# Patient Record
Sex: Male | Born: 1955 | Race: White | Hispanic: Yes | Marital: Married | Smoking: Never smoker
Health system: Southern US, Community
[De-identification: ages and names within clinical notes are randomized; demographics above are authoritative.]

## PROBLEM LIST (undated history)

## (undated) DIAGNOSIS — K219 Gastro-esophageal reflux disease without esophagitis: Secondary | ICD-10-CM

## (undated) DIAGNOSIS — T7840XA Allergy, unspecified, initial encounter: Secondary | ICD-10-CM

## (undated) DIAGNOSIS — I10 Essential (primary) hypertension: Secondary | ICD-10-CM

## (undated) DIAGNOSIS — E785 Hyperlipidemia, unspecified: Secondary | ICD-10-CM

## (undated) HISTORY — DX: Hyperlipidemia, unspecified: E78.5

## (undated) HISTORY — DX: Allergy, unspecified, initial encounter: T78.40XA

## (undated) HISTORY — PX: OTHER SURGICAL HISTORY: SHX169

## (undated) HISTORY — PX: LIPOMA EXCISION: SHX5283

## (undated) HISTORY — DX: Essential (primary) hypertension: I10

## (undated) HISTORY — DX: Gastro-esophageal reflux disease without esophagitis: K21.9

## (undated) HISTORY — PX: ARTHROSCOPY KNEE W/ DRILLING: SUR92

---

## 2010-08-11 ENCOUNTER — Ambulatory Visit (AMBULATORY_SURGERY_CENTER): Payer: BC Managed Care – PPO | Admitting: *Deleted

## 2010-08-11 VITALS — Ht 70.0 in | Wt 196.7 lb

## 2010-08-11 DIAGNOSIS — Z1211 Encounter for screening for malignant neoplasm of colon: Secondary | ICD-10-CM

## 2010-08-11 MED ORDER — PEG-KCL-NACL-NASULF-NA ASC-C 100 G PO SOLR: ORAL | Status: DC

## 2010-08-12 ENCOUNTER — Telehealth: Payer: Self-pay | Admitting: Internal Medicine

## 2010-08-12 ENCOUNTER — Encounter: Payer: Self-pay | Admitting: Internal Medicine

## 2010-08-12 NOTE — Telephone Encounter (Signed)
Movi Prep order called to [facility]  [place]

## 2010-08-18 ENCOUNTER — Encounter: Payer: Self-pay | Admitting: Internal Medicine

## 2010-08-18 ENCOUNTER — Ambulatory Visit (AMBULATORY_SURGERY_CENTER): Payer: BC Managed Care – PPO | Admitting: Internal Medicine

## 2010-08-18 VITALS — BP 143/80 | HR 71 | Temp 98.6°F | Resp 14 | Ht 70.0 in | Wt 192.0 lb

## 2010-08-18 DIAGNOSIS — Z1211 Encounter for screening for malignant neoplasm of colon: Secondary | ICD-10-CM

## 2010-08-18 DIAGNOSIS — K5730 Diverticulosis of large intestine without perforation or abscess without bleeding: Secondary | ICD-10-CM

## 2010-08-18 DIAGNOSIS — R933 Abnormal findings on diagnostic imaging of other parts of digestive tract: Secondary | ICD-10-CM

## 2010-08-18 DIAGNOSIS — K5989 Other specified functional intestinal disorders: Secondary | ICD-10-CM

## 2010-08-18 DIAGNOSIS — K598 Other specified functional intestinal disorders: Secondary | ICD-10-CM

## 2010-08-18 HISTORY — PX: COLONOSCOPY: SHX174

## 2010-08-18 MED ORDER — SODIUM CHLORIDE 0.9 % IV SOLN: 500.0000 mL | INTRAVENOUS | Status: DC

## 2010-08-18 NOTE — Progress Notes (Signed)
Patient assist to bathroom to aide in passing gas. Abdomin soft and nondistented . levsin given sl with no results. Ambulated around nursing station to help in expelling air. Will continue to monitor. Moderate amount of air expelled. Patient stated feel much better. Will discharge home.

## 2010-08-18 NOTE — Patient Instructions (Signed)
Discharge instructions given with verbal understanding. Handout on diverticulosis and hemorrhoids given. Resume previous medications.

## 2010-08-19 ENCOUNTER — Telehealth: Payer: Self-pay | Admitting: *Deleted

## 2010-08-19 NOTE — Telephone Encounter (Signed)
Busy signal

## 2010-10-13 NOTE — Progress Notes (Signed)
Addended by: Virgel Paling on: 10/13/2010 03:43 PM   Modules accepted: Orders

## 2019-05-09 ENCOUNTER — Other Ambulatory Visit (INDEPENDENT_AMBULATORY_CARE_PROVIDER_SITE_OTHER): Payer: BC Managed Care – PPO

## 2019-05-09 ENCOUNTER — Encounter: Payer: Self-pay | Admitting: Internal Medicine

## 2019-05-09 ENCOUNTER — Ambulatory Visit (INDEPENDENT_AMBULATORY_CARE_PROVIDER_SITE_OTHER): Payer: BC Managed Care – PPO | Admitting: Internal Medicine

## 2019-05-09 ENCOUNTER — Other Ambulatory Visit: Payer: Self-pay

## 2019-05-09 VITALS — BP 124/72 | HR 60 | Temp 98.0°F | Ht 71.0 in | Wt 190.4 lb

## 2019-05-09 DIAGNOSIS — K644 Residual hemorrhoidal skin tags: Secondary | ICD-10-CM

## 2019-05-09 DIAGNOSIS — R1032 Left lower quadrant pain: Secondary | ICD-10-CM

## 2019-05-09 DIAGNOSIS — K5730 Diverticulosis of large intestine without perforation or abscess without bleeding: Secondary | ICD-10-CM

## 2019-05-09 LAB — BASIC METABOLIC PANEL
BUN: 18 mg/dL (ref 6–23)
CO2: 29 mEq/L (ref 19–32)
Calcium: 8.9 mg/dL (ref 8.4–10.5)
Chloride: 106 mEq/L (ref 96–112)
Creatinine, Ser: 1.01 mg/dL (ref 0.40–1.50)
GFR: 74.38 mL/min (ref >60.00)
Glucose, Bld: 99 mg/dL (ref 70–99)
Potassium: 3.9 mEq/L (ref 3.5–5.1)
Sodium: 140 mEq/L (ref 135–145)

## 2019-05-09 NOTE — Patient Instructions (Signed)
Your provider has requested that you go to the basement level for lab work before leaving today. Press "B" on the elevator. The lab is located at the first door on the left as you exit the elevator.  You have been scheduled for a CT scan of the abdomen and pelvis at [facility] - 1st floor.  You are scheduled on 05/17/2019 at 8:30am. You should arrive 15 minutes prior to your appointment time for registration. Please follow the written instructions below on the day of your exam:  WARNING: IF YOU ARE ALLERGIC TO IODINE/X-RAY DYE, PLEASE NOTIFY RADIOLOGY IMMEDIATELY AT [phone]! YOU WILL BE GIVEN A 13 HOUR PREMEDICATION PREP.  1) Do not eat or drink anything after 4:30am (4 hours prior to your test) 2) You have been given 2 bottles of oral contrast to drink. The solution may taste better if refrigerated, but do NOT add ice or any other liquid to this solution. Shake well before drinking.    Drink 1 bottle of contrast @ 6:30am (2 hours prior to your exam)  Drink 1 bottle of contrast @ 7:30am (1 hour prior to your exam)  You may take any medications as prescribed with a small amount of water, if necessary. If you take any of the following medications: METFORMIN, GLUCOPHAGE, GLUCOVANCE, AVANDAMET, RIOMET, FORTAMET, [place] MET, JANUMET, GLUMETZA or METAGLIP, you MAY be asked to HOLD this medication 48 hours AFTER the exam.  The purpose of you drinking the oral contrast is to aid in the visualization of your intestinal tract. The contrast solution may cause some diarrhea. Depending on your individual set of symptoms, you may also receive an intravenous injection of x-ray contrast/dye. Plan on being at [facility] for 30 minutes or longer, depending on the type of exam you are having performed.  This test typically takes 30-45 minutes to complete.  If you have any questions regarding your exam or if you need to reschedule, you may call the CT department at [phone] between the  hours of 8:00 am and 5:00 pm, Monday-Friday.  ________________________________________________________________________

## 2019-05-09 NOTE — Progress Notes (Signed)
HISTORY OF PRESENT ILLNESS:  Albert Cox is a 64 y.o. male with past medical history as listed below who is sent today by his primary care provider regarding left lower quadrant pain.  Question hernia.  I last saw the patient August 18, 2010 when he underwent routine screening colonoscopy.  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  An erythematous left colon fold was biopsied and returned benign changes.  He is accompanied today by his wife.  He tells me that he has had intermittent fleeting left lower quadrant pain for approximately 1 to 2 months.  He is concerned that he has a hernia.  No fevers.  Reports normal regular bowel movements without change.  No bleeding.  He has had some weight gain.  He has not had his Covid vaccination to date.  He does not identify any particular factors that seem to either relieve or exacerbate the discomfort.  No discomfort yesterday.  Some discomfort currently.  His GI review of systems is otherwise remarkable for what he describes as external hemorrhoids.  He declines my offer to evaluate these for him.  I have reviewed outside evaluation with his primary care provider.  The office note did raise the question of left inguinal hernia.  Review of blood work from April 25, 2019 shows unremarkable comprehensive metabolic panel.  Unremarkable CBC.  Normal hemoglobin of 14.6.  Normal liver test.  REVIEW OF SYSTEMS:  All non-GI ROS negative unless otherwise stated in the HPI except for allergies  Past Medical History:  Diagnosis Date  . Allergy   . GERD (gastroesophageal reflux disease)   . Hyperlipidemia   . Hypertension     Past Surgical History:  Procedure Laterality Date  . ARTHROSCOPY KNEE W/ DRILLING     lt. knee  . coreneal transplants     both eyes    Social History Albert Cox  reports that he has never smoked. He has never used smokeless tobacco. He reports that he does not drink alcohol or use drugs.  family history  includes Alcohol abuse in his father; Heart disease in his mother.  No Known Allergies     PHYSICAL EXAMINATION: Vital signs: BP 124/72   Pulse 60   Temp 98 F (36.7 C)   Ht 5\' 11"  (1.803 m)   Wt 190 lb 6 oz (86.4 kg)   BMI 26.55 kg/m   Constitutional: generally well-appearing, no acute distress Psychiatric: alert and oriented x3, cooperative Eyes: extraocular movements intact, anicteric, conjunctiva pink Mouth: Mask Neck: supple no lymphadenopathy Cardiovascular: heart regular rate and rhythm, no murmur Lungs: clear to auscultation bilaterally Abdomen: soft, focal left lower quadrant tenderness, nondistended, no obvious ascites, no peritoneal signs, normal bowel sounds, no organomegaly.  No hernia Groin.  No obvious hernia Rectal: Declined Extremities: no clubbing, cyanosis, or lower extremity edema bilaterally Skin: no lesions on visible extremities Neuro: No focal deficits.  Cranial nerves intact  ASSESSMENT:  1.  37-month history of intermittent left lower quadrant pain.  Rule out diverticulitis.  Rule out diverticular spasm.  Rule out other entity 2.  Screening colonoscopy 2012 with severe pandiverticulosis.  No neoplasia. 3.  Complaints of external hemorrhoids.  Declined evaluation but inquired about banding procedure at the time of his next screening colonoscopy.  I explained to him the difference between internal and external hemorrhoids and the various treatment options.  Unclear what would be best for him without directly evaluating.  He understands.  PLAN:  1.  High-fiber diet  2.  Schedule CT scan of the abdomen and pelvis to evaluate persistent left lower quadrant pain.  We will contact him with the results after they are available. 3.  No evidence for hernia 4.  Screening colonoscopy around June 2022 5.  Further recommendations after the above completed

## 2019-05-17 ENCOUNTER — Ambulatory Visit (HOSPITAL_COMMUNITY)
Admission: RE | Admit: 2019-05-17 | Discharge: 2019-05-17 | Disposition: A | Discharge status: Home / Self Care | Payer: BC Managed Care – PPO | Source: Ambulatory Visit | Attending: Internal Medicine | Admitting: Internal Medicine

## 2019-05-17 ENCOUNTER — Other Ambulatory Visit: Payer: Self-pay

## 2019-05-17 ENCOUNTER — Encounter (HOSPITAL_COMMUNITY): Payer: Self-pay

## 2019-05-17 DIAGNOSIS — N289 Disorder of kidney and ureter, unspecified: Secondary | ICD-10-CM

## 2019-05-17 DIAGNOSIS — R1032 Left lower quadrant pain: Secondary | ICD-10-CM | POA: Diagnosis not present

## 2019-05-17 IMAGING — CT CT ABD-PELV W/ CM
2 of 5 series · 15 of 46 positions shown, 17 images · IV contrast (OMNIPAQUE)
Comparison: None.

CLINICAL DATA: Left lower quadrant abdominal pain for 1 month.

EXAM:
CT ABDOMEN AND PELVIS WITH CONTRAST
TECHNIQUE: Multidetector CT imaging of the abdomen and pelvis was performed
using the standard protocol following bolus administration of
intravenous contrast.
CONTRAST:  100mL OMNIPAQUE IOHEXOL 300 MG/ML  SOLN

[Series 2: axial st · axial · 0.80mm/px · z∈[+1166,+1586]mm · 12 of 98 slices shown, 14 images]
[im 7/98  soft-tissue]
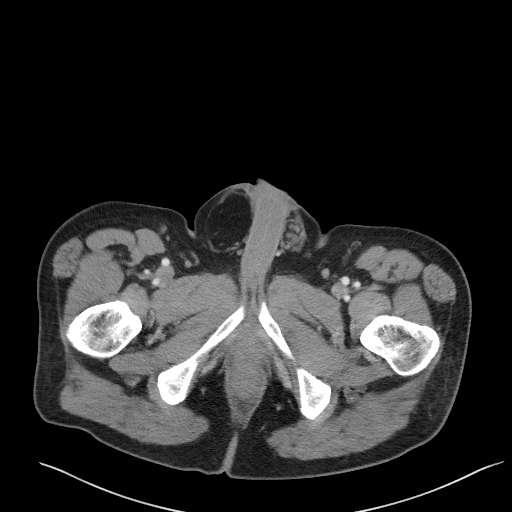
[im 7/98  bone]
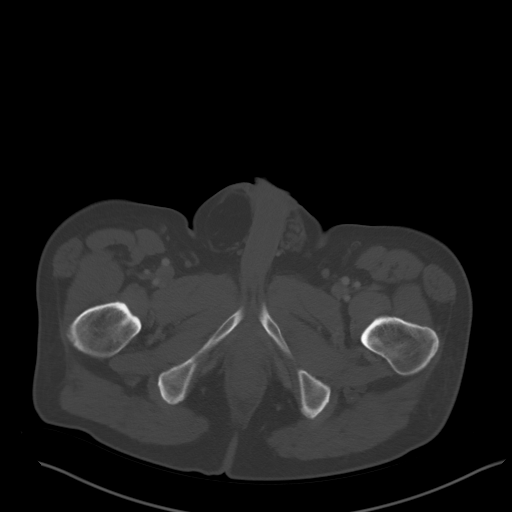
[im 13/98  soft-tissue]
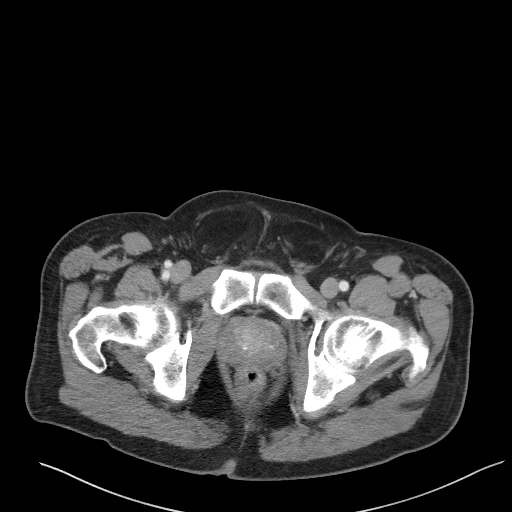
[im 25/98  soft-tissue]
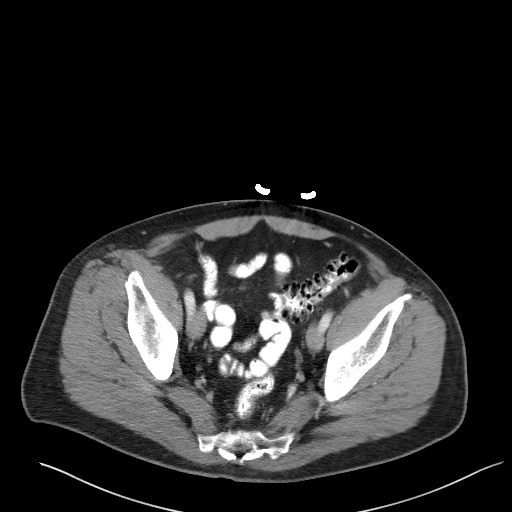
[im 31/98  soft-tissue]
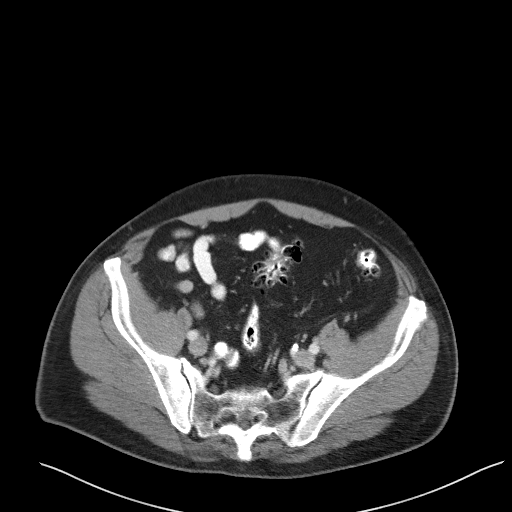
[im 37/98  soft-tissue]
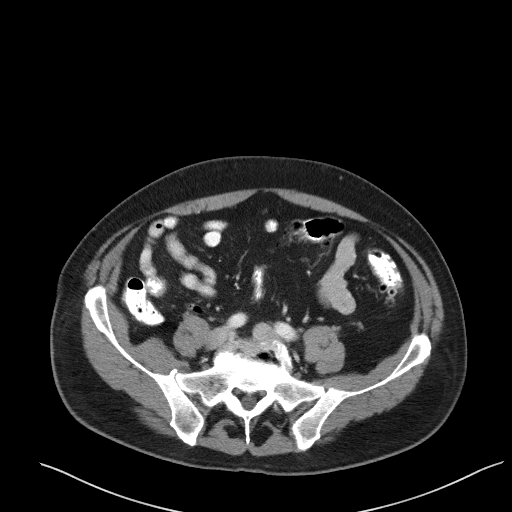
[im 43/98  soft-tissue]
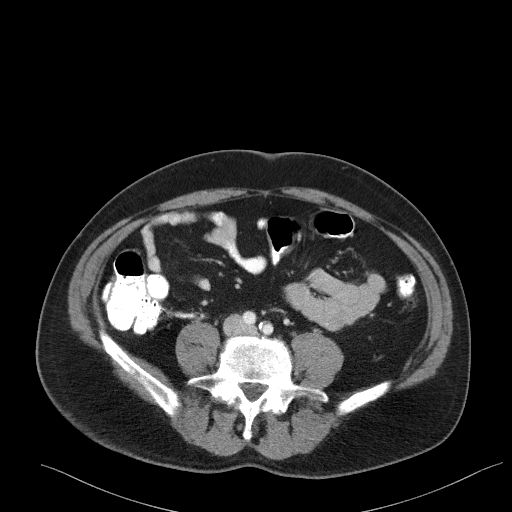
[im 55/98  soft-tissue]
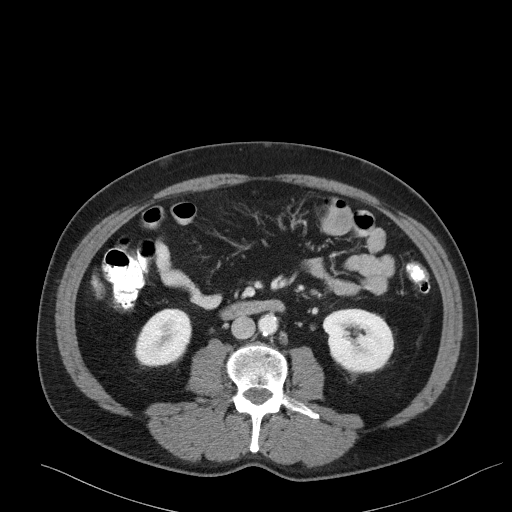
[im 61/98  soft-tissue]
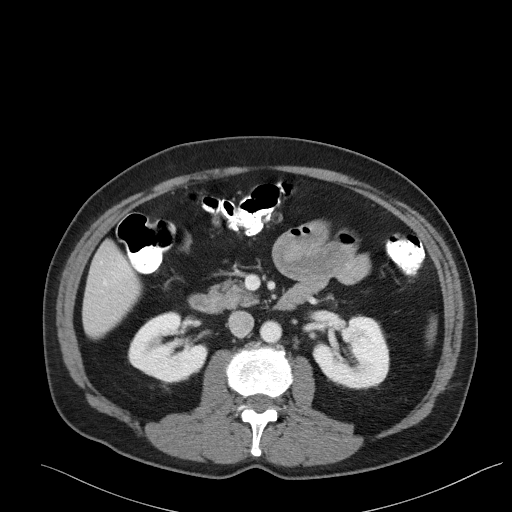
[im 67/98  soft-tissue]
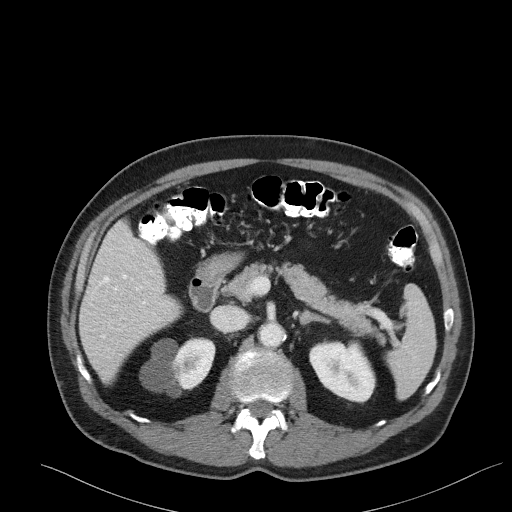
[im 67/98  bone]
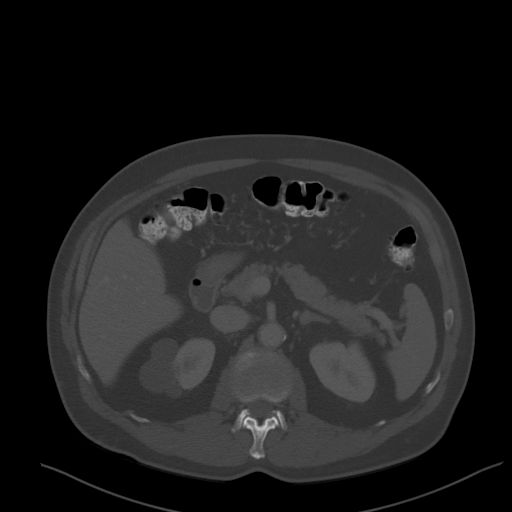
[im 73/98  soft-tissue]
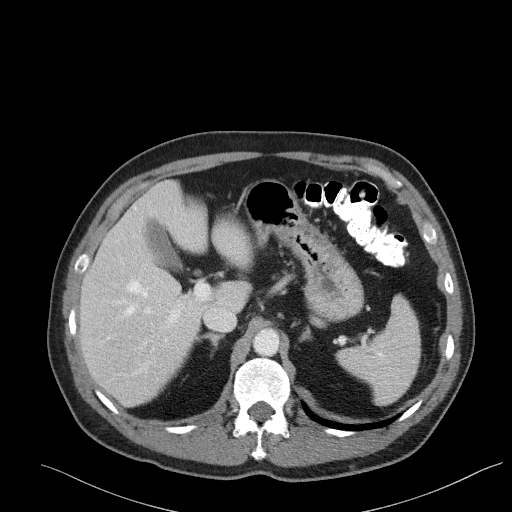
[im 85/98  soft-tissue]
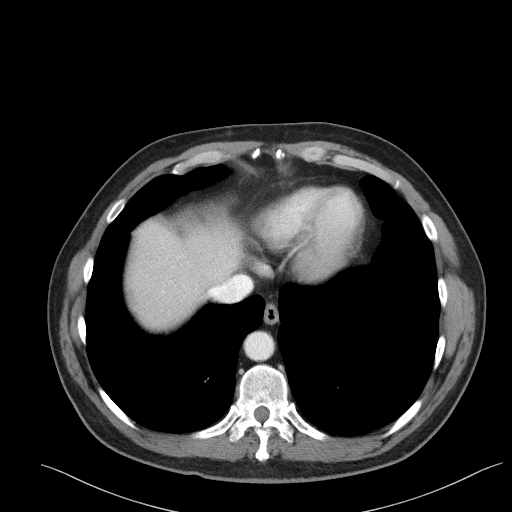
[im 91/98  soft-tissue]
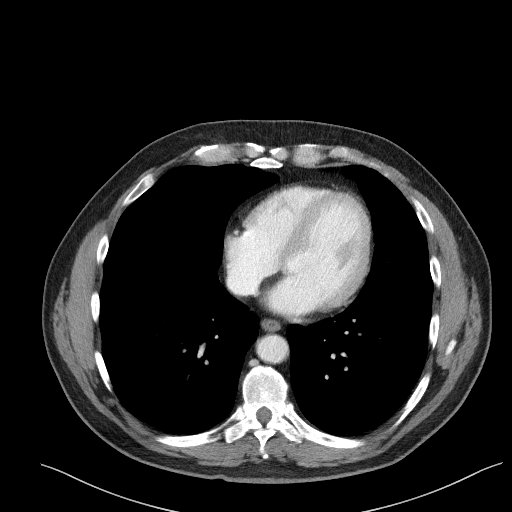

[Series 4: coronal st · coronal · 0.77mm/px · 3 of 98 slices shown]
[im 33/98  soft-tissue]
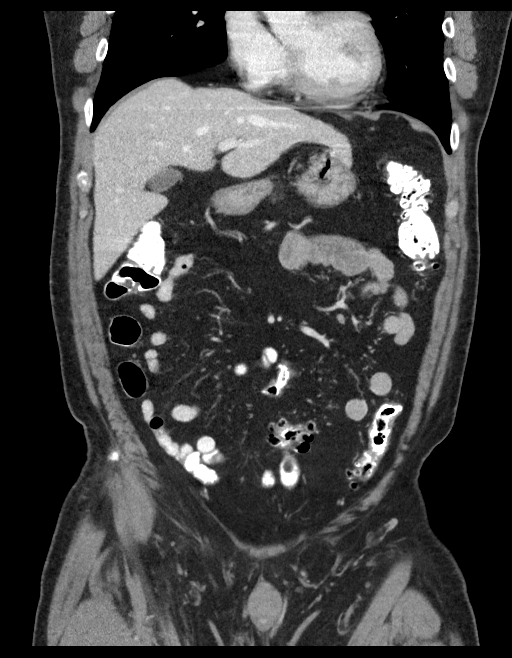
[im 44/98  soft-tissue]
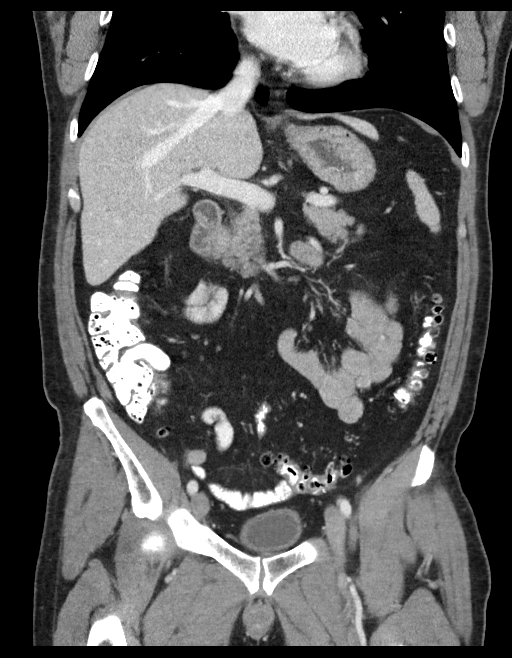
[im 54/98  soft-tissue]
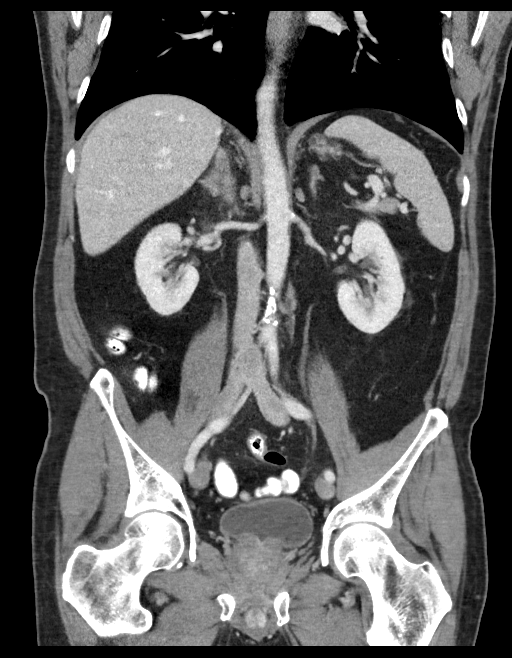

[15 of 46 positions shown; findings below may reference images not displayed]

FINDINGS: Lower chest: 6 mm pulmonary nodule identified left lower lobe on
image 43/6.

Hepatobiliary: No suspicious focal abnormality within the liver
parenchyma. There is no evidence for gallstones, gallbladder wall
thickening, or pericholecystic fluid. No intrahepatic or
extrahepatic biliary dilation.

Pancreas: No focal mass lesion. No dilatation of the main duct. No
intraparenchymal cyst. No peripancreatic edema.

Spleen: No splenomegaly. No focal mass lesion.

Adrenals/Urinary Tract: No adrenal nodule or mass. Complex
multicystic lesion in the upper pole right kidney measures 4.8 x
x 4.0 cm with dystrophic calcification along the inferior margin.
Left kidney unremarkable. No evidence for hydroureter. The urinary
bladder appears normal for the degree of distention.

Stomach/Bowel: Stomach is unremarkable. No gastric wall thickening.
No evidence of outlet obstruction. Duodenum is normally positioned
as is the ligament of Treitz. No small bowel wall thickening. No
small bowel dilatation. The terminal ileum is normal. The appendix
is normal. No gross colonic mass. No colonic wall thickening.
Diverticular changes are noted in the left colon without evidence of
diverticulitis.

Vascular/Lymphatic: There is abdominal aortic atherosclerosis
without aneurysm. There is no gastrohepatic or hepatoduodenal
ligament lymphadenopathy. No retroperitoneal or mesenteric
lymphadenopathy. No pelvic sidewall lymphadenopathy.

Reproductive: Prostate gland appears enlarged.

Other: No intraperitoneal free fluid.

Musculoskeletal: Bilateral groin hernias contain only fat, right
greater than left. Small umbilical hernia contains only fat. No
worrisome lytic or sclerotic osseous abnormality.
IMPRESSION: 1. No acute findings in the abdomen or pelvis. Specifically, no
findings to explain the patient's history of left lower quadrant
pain.
2. 4.8 cm complex multicystic lesion upper pole right kidney with
dystrophic calcification. MRI without and with contrast recommended
to further evaluate.
3. 6 mm left lower lobe pulmonary nodule. Non-contrast chest CT at
6-12 months is recommended. If the nodule is stable at time of
repeat CT, then future CT at 18-24 months (from today's scan) is
considered optional for low-risk patients, but is recommended for
high-risk patients. This recommendation follows the consensus
statement: Guidelines for Management of Incidental Pulmonary Nodules
Detected on CT Images: From the [HOSPITAL] [Y9]; Radiology
4. Left colonic diverticulosis without diverticulitis.
5. Aortic Atherosclerosis ([Y9]-[Y9]).

## 2019-05-17 MED ORDER — SODIUM CHLORIDE (PF) 0.9 % IJ SOLN
INTRAMUSCULAR | Status: AC
  Filled 2019-05-17: qty 50

## 2019-05-17 MED ORDER — IOHEXOL 300 MG/ML  SOLN
100.0000 mL | Freq: Once | INTRAMUSCULAR | Status: AC | PRN
  Administered 2019-05-17: 100 mL via INTRAVENOUS

## 2019-05-24 ENCOUNTER — Other Ambulatory Visit: Payer: Self-pay

## 2019-05-24 DIAGNOSIS — R9389 Abnormal findings on diagnostic imaging of other specified body structures: Secondary | ICD-10-CM

## 2019-05-24 DIAGNOSIS — N289 Disorder of kidney and ureter, unspecified: Secondary | ICD-10-CM

## 2019-05-28 ENCOUNTER — Other Ambulatory Visit: Payer: Self-pay

## 2019-05-28 ENCOUNTER — Ambulatory Visit (HOSPITAL_COMMUNITY)
Admission: RE | Admit: 2019-05-28 | Discharge: 2019-05-28 | Disposition: A | Discharge status: Home / Self Care | Payer: BC Managed Care – PPO | Source: Ambulatory Visit | Attending: Internal Medicine | Admitting: Internal Medicine

## 2019-05-28 DIAGNOSIS — R9389 Abnormal findings on diagnostic imaging of other specified body structures: Secondary | ICD-10-CM

## 2019-05-28 DIAGNOSIS — N289 Disorder of kidney and ureter, unspecified: Secondary | ICD-10-CM

## 2019-05-28 IMAGING — MR MR ABDOMEN WO/W CM
18 series · 48 of 48 positions shown · IV contrast (9 GADAVIST)
Comparison: CT scan [DATE]

CLINICAL DATA: 4.8 cm complex multicystic right renal lesion.

EXAM:
MRI ABDOMEN WITHOUT AND WITH CONTRAST
TECHNIQUE: Multiplanar multisequence MR imaging of the abdomen was performed
both before and after the administration of intravenous contrast.
CONTRAST:  9mL GADAVIST GADOBUTROL 1 MMOL/ML IV SOLN

[Series 2: T2 · coronal · 6.0mm · 1.56mm/px · 2 of 30 slices shown]
[im 1/30]
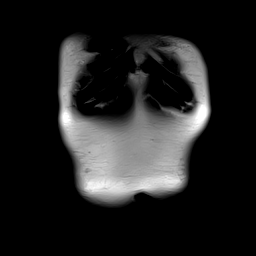
[im 30/30]
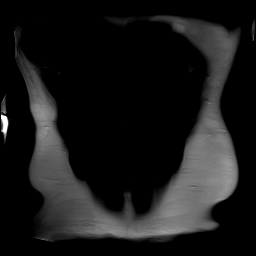

[Series 3: T2 fat-sat · 2 of 6 slices shown (1 of 2)]
[im 1/6]
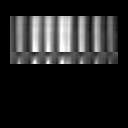
[im 6/6]
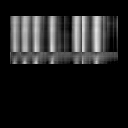

[Series 4: T2 fat-sat · axial · 6.0mm · 1.25mm/px · z∈[-89,+163]mm · 2 of 36 slices shown (2 of 2)]
[im 1/36]
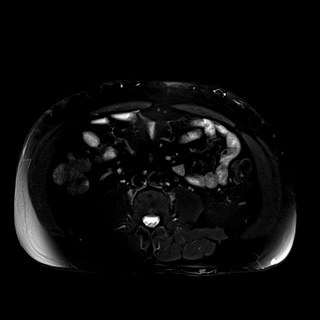
[im 36/36]
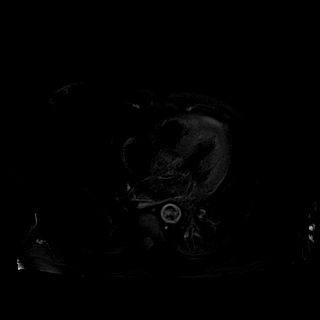

[Series 5: T1 · axial · 3.0mm · 1.25mm/px · z∈[-91,+146]mm · 3 of 80 slices shown (1 of 2)]
[im 1/80]
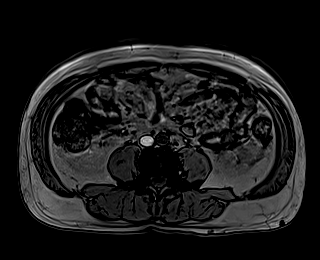
[im 40/80]
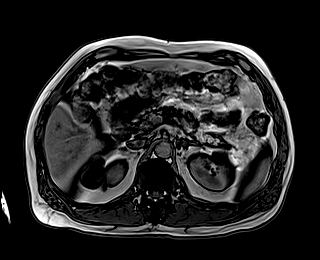
[im 80/80]
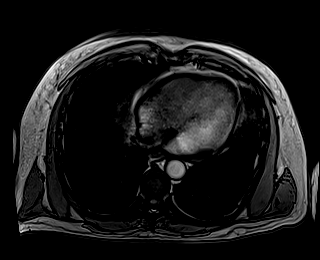

[Series 6: T1 · axial · 3.0mm · 1.25mm/px · z∈[-91,+146]mm · 3 of 80 slices shown (2 of 2)]
[im 1/80]
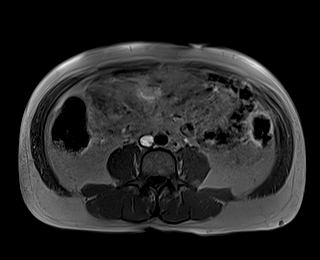
[im 40/80]
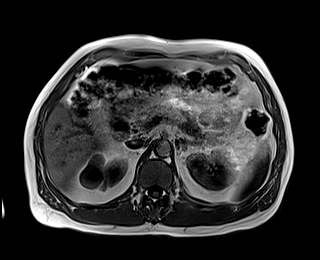
[im 80/80]
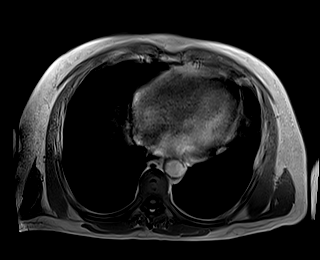

[Series 7: DWI · axial · 6.0mm · 1.49mm/px · z∈[-103,+149]mm · 3 of 72 slices shown (1 of 2)]
[im 1/72]
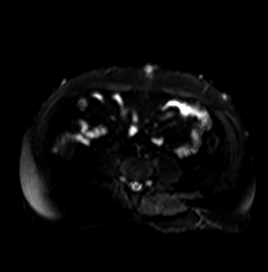
[im 36/72]
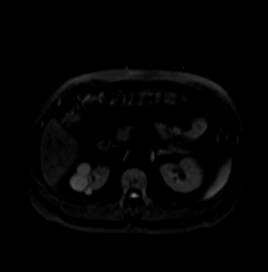
[im 72/72]
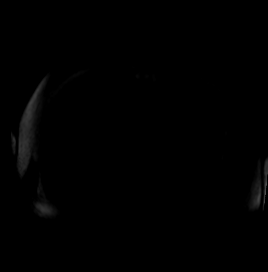

[Series 8: DWI · axial · 6.0mm · 1.49mm/px · 1 of 36 slices shown (2 of 2)]
[im 1/36]
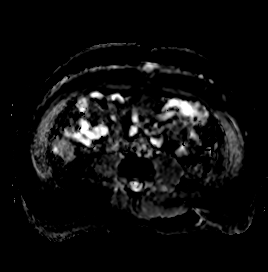

[Series 9: bSSFP · axial · 5.0mm · 0.84mm/px · z∈[-91,+146]mm · 2 of 44 slices shown]
[im 1/44]
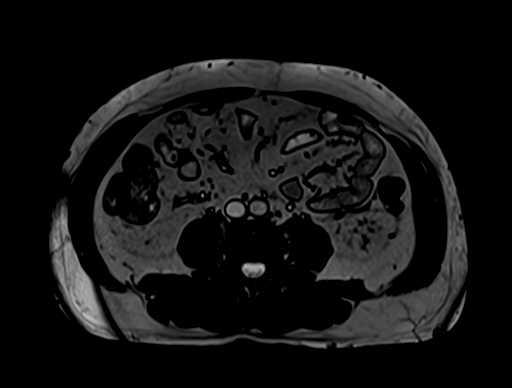
[im 44/44]
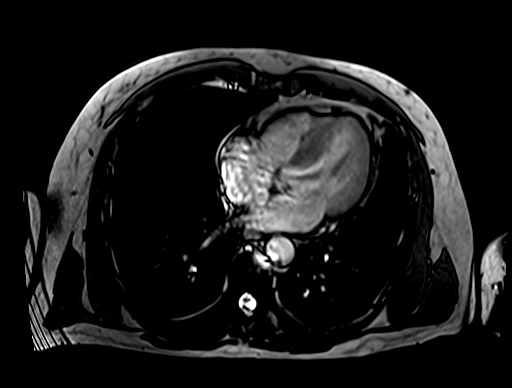

[Series 11: T1 dynamic · axial · 3.0mm · 1.25mm/px · z∈[-73,+164]mm · 3 of 80 slices shown (1 of 10)]
[im 1/80]
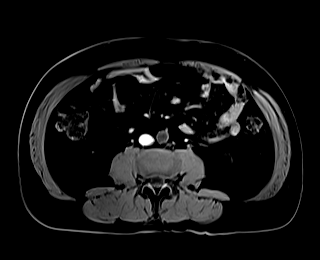
[im 40/80]
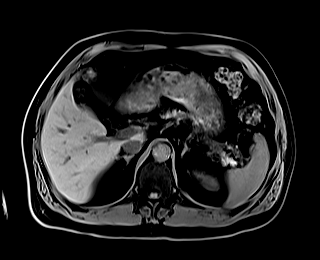
[im 80/80]
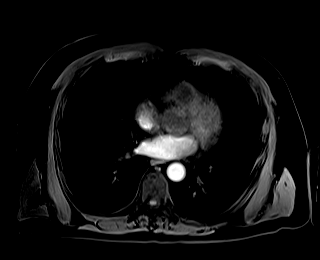

[Series 15: T1 dynamic · axial · 3.0mm · 1.25mm/px · z∈[-73,+164]mm · 3 of 80 slices shown (2 of 10)]
[im 1/80]
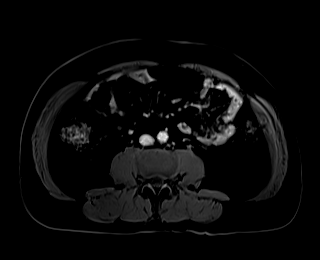
[im 40/80]
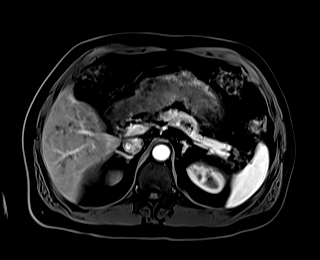
[im 80/80]
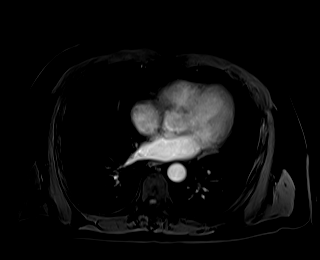

[Series 16: T1 dynamic · axial · 3.0mm · 1.25mm/px · z∈[-73,+164]mm · 3 of 80 slices shown (3 of 10)]
[im 1/80]
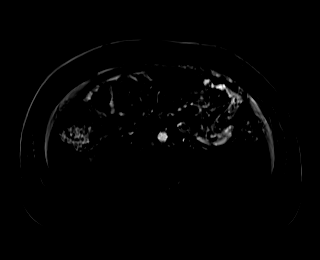
[im 40/80]
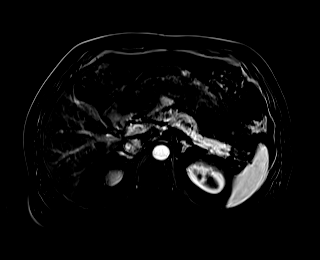
[im 80/80]
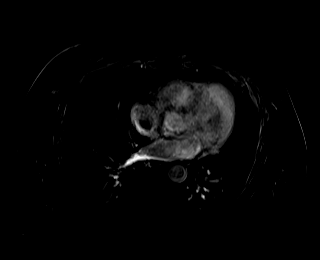

[Series 19: T1 dynamic · axial · 3.0mm · 1.25mm/px · z∈[-73,+164]mm · 3 of 80 slices shown (4 of 10)]
[im 1/80]
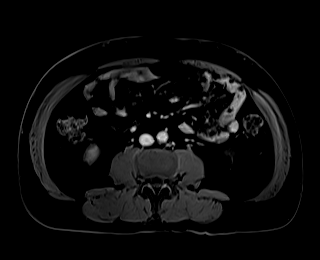
[im 40/80]
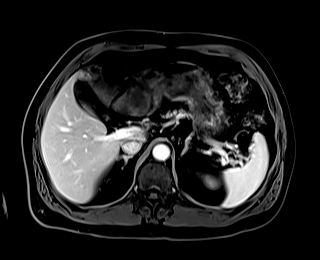
[im 80/80]
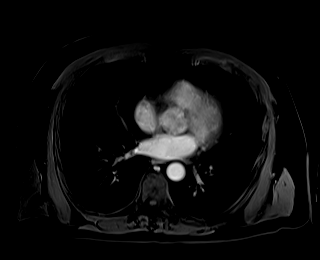

[Series 20: T1 dynamic · axial · 3.0mm · 1.25mm/px · z∈[-73,+164]mm · 3 of 80 slices shown (5 of 10)]
[im 1/80]
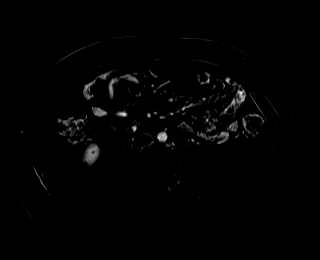
[im 40/80]
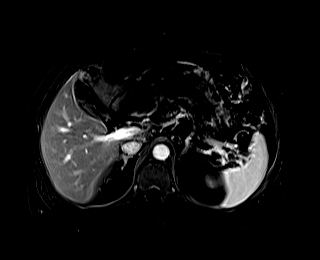
[im 80/80]
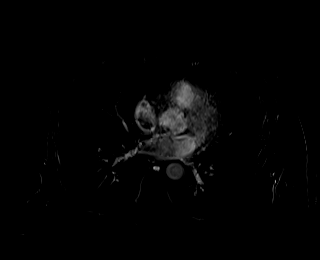

[Series 23: T1 dynamic · axial · 3.0mm · 1.25mm/px · z∈[-73,+164]mm · 3 of 80 slices shown (6 of 10)]
[im 1/80]
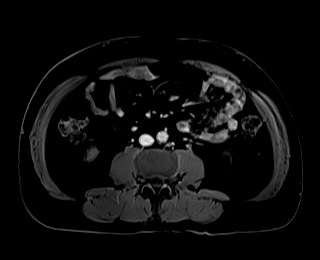
[im 40/80]
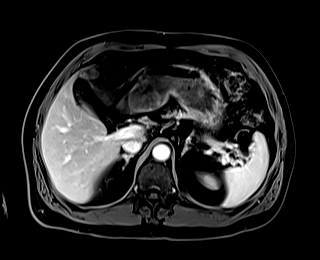
[im 80/80]
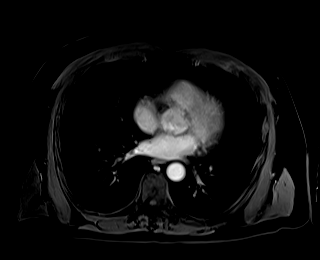

[Series 24: T1 dynamic · axial · 3.0mm · 1.25mm/px · z∈[-73,+164]mm · 3 of 80 slices shown (7 of 10)]
[im 1/80]
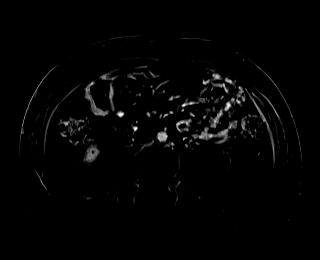
[im 40/80]
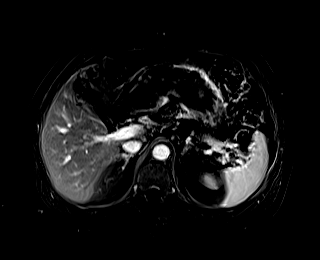
[im 80/80]
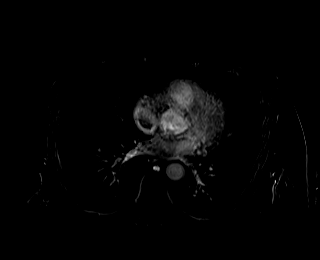

[Series 25: T1 dynamic · coronal · 3.0mm · 1.41mm/px · 3 of 72 slices shown (8 of 10)]
[im 1/72]
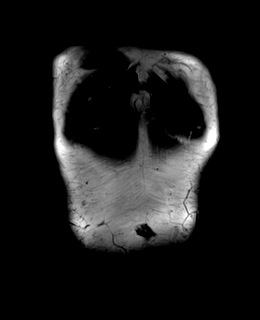
[im 36/72]
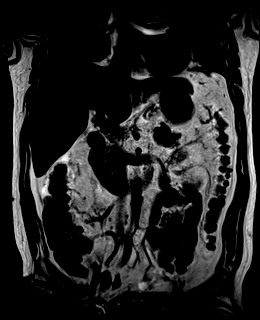
[im 72/72]
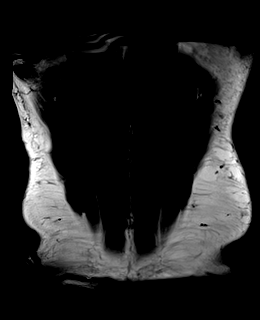

[Series 26: T1 dynamic · coronal · 3.0mm · 1.41mm/px · 3 of 72 slices shown (9 of 10)]
[im 1/72]
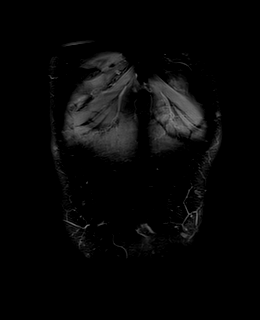
[im 36/72]
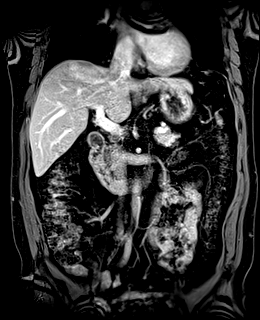
[im 72/72]
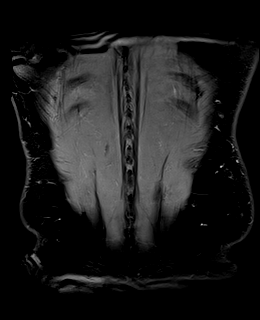

[Series 28: T1 dynamic · axial · 3.0mm · 1.25mm/px · z∈[-84,+153]mm · 3 of 80 slices shown (10 of 10)]
[im 1/80]
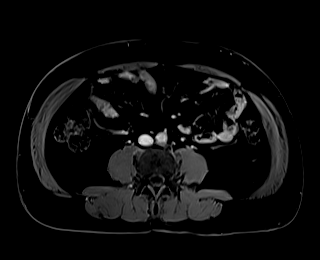
[im 40/80]
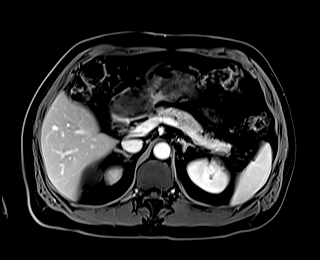
[im 80/80]
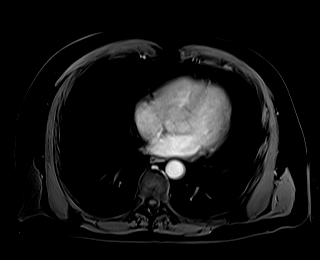

[48 of 48 positions shown; findings below may reference images not displayed]

FINDINGS: Lower chest: Unremarkable.

Hepatobiliary: No suspicious focal abnormality within the liver
parenchyma. There is no evidence for gallstones, gallbladder wall
thickening, or pericholecystic fluid. No intrahepatic or
extrahepatic biliary dilation.

Pancreas: No focal mass lesion. No dilatation of the main duct. No
intraparenchymal cyst. No peripancreatic edema.

Spleen:  Tiny cyst or pseudocyst noted in the dome of the spleen.

Adrenals/Urinary Tract: No adrenal nodule or mass. Left kidney
unremarkable.

As seen on recent CT scan, there is a multicystic lesion in the
upper interpolar region of the right kidney measuring 4.7 x 3.0 x
3.6 cm on today's MRI. The dystrophic calcifications seen on
previous CT is not evident on MR imaging. Multiple thin, smooth
septations are identified without measurable enhancement. There is
prominent cortical thinning with scarring/irregularity along the
inferior aspect of this margin. No suspicious irregular mural
thickening or mural nodularity.

Stomach/Bowel: Stomach is unremarkable. No gastric wall thickening.
No evidence of outlet obstruction. Duodenum is normally positioned
as is the ligament of Treitz. No small bowel or colonic dilatation
within the visualized abdomen.

Vascular/Lymphatic: No abdominal aortic aneurysm. No abdominal
lymphadenopathy.

Other:  No intraperitoneal free fluid.

Musculoskeletal: No abnormal marrow enhancement within the
visualized bony anatomy.
IMPRESSION: 1. 4.7 x 3.0 x 3.6 cm multicystic lesion in the upper interpolar
region of the right kidney is likely benign with no overtly
suspicious imaging features on today's exam. There is focal cortical
thinning/scarring along the inferior aspect of this lesion which
create some irregular enhancing tissue and it unclear whether this
is related to the background renal parenchyma or the lesion itself
although I favor that this is related to background
parenchymal/cortical scarring. As such, this lesion is categorized
as Bosniak II F and follow-up MRI in 6 months recommended.

## 2019-05-28 MED ORDER — GADOBUTROL 1 MMOL/ML IV SOLN
10.0000 mL | Freq: Once | INTRAVENOUS | Status: AC | PRN
  Administered 2019-05-28: 9 mL via INTRAVENOUS

## 2020-05-20 ENCOUNTER — Other Ambulatory Visit: Payer: Self-pay

## 2020-05-20 ENCOUNTER — Emergency Department (HOSPITAL_COMMUNITY)
Admission: EM | Admit: 2020-05-20 | Discharge: 2020-05-20 | Disposition: A | Discharge status: Home / Self Care | Payer: BC Managed Care – PPO | Attending: Emergency Medicine | Admitting: Emergency Medicine

## 2020-05-20 ENCOUNTER — Encounter (HOSPITAL_COMMUNITY): Payer: Self-pay

## 2020-05-20 DIAGNOSIS — R066 Hiccough: Secondary | ICD-10-CM | POA: Insufficient documentation

## 2020-05-20 DIAGNOSIS — Z79899 Other long term (current) drug therapy: Secondary | ICD-10-CM | POA: Diagnosis not present

## 2020-05-20 DIAGNOSIS — I10 Essential (primary) hypertension: Secondary | ICD-10-CM | POA: Insufficient documentation

## 2020-05-20 DIAGNOSIS — Z7982 Long term (current) use of aspirin: Secondary | ICD-10-CM | POA: Insufficient documentation

## 2020-05-20 MED ORDER — METOCLOPRAMIDE HCL 10 MG PO TABS: 10.0000 mg | ORAL_TABLET | Freq: Three times a day (TID) | ORAL | 0 refills | Status: DC | PRN

## 2020-05-20 NOTE — ED Triage Notes (Signed)
Pt c/o hiccups since Thursday. Pt tried omeprazole without relief. Pt states he had 1 episode of vomit on Friday.

## 2020-05-20 NOTE — ED Provider Notes (Signed)
[facility]-EMERGENCY DEPT Provider Note   CSN: [id number] Arrival date & time: 05/20/20  1629     History Chief Complaint  Patient presents with  . Hiccups    Albert Cox is a 65 y.o. male.  HPI Patient presents with hiccups. Patient just arrived via airplane from a trip to [place].  He notes that he has had the hiccups for the duration of his trip, 5 days.  Prior to this, he was generally well aside from 1 day of diarrhea-like illness.  Other family members had a similar illness at that time.  He had no GI issues during the travel, no abdominal pain, and continues to deny abdominal pain aside from discomfort with hiccuping. Pain 1 episode of vomiting due to the hiccups, no fever, no dyspnea, no other complaints. He has had transient relief only with breathing intentionally, otherwise no clear alleviating or exacerbating factors.    Past Medical History:  Diagnosis Date  . Allergy   . GERD (gastroesophageal reflux disease)   . Hyperlipidemia   . Hypertension     There are no problems to display for this patient.   Past Surgical History:  Procedure Laterality Date  . ARTHROSCOPY KNEE W/ DRILLING     lt. knee  . coreneal transplants     both eyes       Family History  Problem Relation Age of Onset  . Heart disease Mother   . Alcohol abuse Father     Social History   Tobacco Use  . Smoking status: Never Smoker  . Smokeless tobacco: Never Used  Vaping Use  . Vaping Use: Never used  Substance Use Topics  . Alcohol use: No    Comment: occasional  . Drug use: No    Home Medications Prior to Admission medications   Medication Sig Start Date End Date Taking? Authorizing Provider  metoCLOPramide (REGLAN) 10 MG tablet Take 1 tablet (10 mg total) by mouth every 8 (eight) hours as needed (hiccups). 05/20/20  Yes Gerhard Munch, MD  aspirin 81 MG tablet Take 81 mg by mouth daily.      [provider]  BYSTOLIC 10 MG tablet Take  10 mg by mouth daily. 05/06/19   [provider]  Calcium Carbonate-Vitamin D (CALCIUM-VITAMIN D) 500-200 MG-UNIT per tablet Take 1 tablet by mouth 3 (three) times a week.      [provider]  fish oil-omega-3 fatty acids 1000 MG capsule Take 1 g by mouth 3 (three) times a week.      [provider]  hydrALAZINE (APRESOLINE) 50 MG tablet Take 75 mg by mouth 2 (two) times daily.    [provider]  pravastatin (PRAVACHOL) 20 MG tablet Take 20 mg by mouth daily. 03/05/19   [provider]  pyridoxine (B-6) 200 MG tablet Take 100 mg by mouth daily.      [provider]  vitamin C (ASCORBIC ACID) 500 MG tablet Take 500 mg by mouth daily.      [provider]    Allergies    Patient has no known allergies.  Review of Systems   Review of Systems  Constitutional:       Per HPI, otherwise negative  HENT:       Per HPI, otherwise negative  Respiratory:       Per HPI, otherwise negative  Cardiovascular:       Per HPI, otherwise negative  Gastrointestinal: Negative for vomiting.  Endocrine:  Negative aside from HPI  Genitourinary:       Neg aside from HPI   Musculoskeletal:       Per HPI, otherwise negative  Skin: Negative.   Neurological: Negative for syncope.    Physical Exam Updated Vital Signs BP (!) 153/73 (BP Location: Left Arm)   Pulse (!) 56   Temp 97.6 F (36.4 C) (Oral)   Resp 18   Ht 5\' 10"  (1.778 m)   Wt 81.6 kg   SpO2 100%   BMI 25.83 kg/m   Physical Exam Vitals and nursing note reviewed.  Constitutional:      Appearance: He is well-developed.     Comments: Uncomfortable appearing adult male sitting upright, actively with hiccups  HENT:     Head: Normocephalic and atraumatic.  Eyes:     Conjunctiva/sclera: Conjunctivae normal.  Cardiovascular:     Rate and Rhythm: Normal rate and regular rhythm.  Pulmonary:     Effort: Pulmonary effort is normal. No respiratory distress.     Breath sounds: No  stridor.  Abdominal:     General: There is no distension.  Skin:    General: Skin is warm and dry.  Neurological:     Mental Status: He is alert and oriented to person, place, and time.     ED Results / Procedures / Treatments   Labs (all labs ordered are listed, but only abnormal results are displayed) Labs Reviewed - No data to display  EKG None  Radiology No results found.  Procedures Procedures   Medications Ordered in ED Medications - No data to display  ED Course  I have reviewed the triage vital signs and the nursing notes.  Pertinent labs & imaging results that were available during my care of the patient were reviewed by me and considered in my medical decision making (see chart for details).  After initial evaluation, I used a physiologic manipulation technique, and the patient had resolution of his hiccups.  He had no recurrence in the emergency department.  Without other complaints, without hemodynamic instability, patient discharged in stable condition with return precautions, ongoing instructions for hiccup management, and backup plan with prescriptions as well. Here, without other abdominal pain, fever, complaints, low suspicion for acute abdominal processes, no evidence of bacteremia, sepsis, no evidence for respiratory issues. Final Clinical Impression(s) / ED Diagnoses Final diagnoses:  Hiccups    Rx / DC Orders ED Discharge Orders         Ordered    metoCLOPramide (REGLAN) 10 MG tablet  Every 8 hours PRN        05/20/20 1714           05/22/20, MD 05/20/20 1717

## 2020-05-20 NOTE — Discharge Instructions (Signed)
As discussed, with your hiccups, you may experience a recurrence.  This is normal.  If the methods we demonstrated in the emergency department do not resolve your episode, please use the prescribed medication, up to 3 times daily.

## 2020-08-26 ENCOUNTER — Encounter: Payer: Self-pay | Admitting: Internal Medicine

## 2021-02-28 LAB — COLOGUARD: COLOGUARD: POSITIVE — AB

## 2021-03-03 ENCOUNTER — Encounter: Payer: Self-pay | Admitting: Internal Medicine

## 2021-04-09 ENCOUNTER — Ambulatory Visit (AMBULATORY_SURGERY_CENTER): Payer: Medicare Other | Admitting: *Deleted

## 2021-04-09 ENCOUNTER — Other Ambulatory Visit: Payer: Self-pay

## 2021-04-09 VITALS — Ht 71.0 in | Wt 178.0 lb

## 2021-04-09 DIAGNOSIS — Z1211 Encounter for screening for malignant neoplasm of colon: Secondary | ICD-10-CM

## 2021-04-09 MED ORDER — PEG 3350-KCL-NA BICARB-NACL 420 G PO SOLR: 4000.0000 mL | Freq: Once | ORAL | 0 refills | Status: AC

## 2021-04-09 NOTE — Progress Notes (Signed)
Patient's pre-visit was done today over the phone with the patient. Name,DOB and address verified. Patient denies any allergies to Eggs and Soy. Patient denies any problems with anesthesia/hard to wake up from colonoscopy sedation last time. Patient is not taking any diet pills or blood thinners. No home Oxygen.   Prep instructions mailed to pt-pt aware. Patient understands to call us back with any questions or concerns. Patient is aware of our care-partner policy and Covid-19 safety protocol.   The patient is COVID-19 vaccinated.

## 2021-04-20 ENCOUNTER — Encounter: Payer: Self-pay | Admitting: Internal Medicine

## 2021-04-22 ENCOUNTER — Ambulatory Visit (AMBULATORY_SURGERY_CENTER): Payer: Medicare Other | Admitting: Internal Medicine

## 2021-04-22 ENCOUNTER — Encounter: Payer: Self-pay | Admitting: Internal Medicine

## 2021-04-22 ENCOUNTER — Telehealth: Payer: Self-pay | Admitting: Internal Medicine

## 2021-04-22 VITALS — BP 127/74 | HR 57 | Temp 97.1°F | Resp 12 | Ht 71.0 in | Wt 178.0 lb

## 2021-04-22 DIAGNOSIS — R195 Other fecal abnormalities: Secondary | ICD-10-CM | POA: Diagnosis not present

## 2021-04-22 DIAGNOSIS — Z1211 Encounter for screening for malignant neoplasm of colon: Secondary | ICD-10-CM

## 2021-04-22 MED ORDER — SODIUM CHLORIDE 0.9 % IV SOLN: 500.0000 mL | Freq: Once | INTRAVENOUS | Status: DC

## 2021-04-22 NOTE — Progress Notes (Signed)
HISTORY OF PRESENT ILLNESS:  Albert Cox is a 66 y.o. male who presents today for colon cancer screening.  Previous colonoscopy 2012 was negative for neoplasia.  No complaints except for difficulty cleaning post defecation.  He thinks he has a prolapsing hemorrhoid. Patient was evaluated in 2021 and underwent imaging.  MRI May 29, 2019 showed a multicystic right kidney lesion for which radiologic follow-up in 6 months is recommended.  I communicated with the patient and his PCP regarding follow-up imaging.  This was never done.   REVIEW OF SYSTEMS:  All non-GI ROS negative. Past Medical History:  Diagnosis Date   Allergy    GERD (gastroesophageal reflux disease)    Hyperlipidemia    Hypertension     Past Surgical History:  Procedure Laterality Date   ARTHROSCOPY KNEE W/ DRILLING     lt. knee   COLONOSCOPY  08/18/2010   Dr.Kella Splinter -divericulosis   coreneal transplants     both eyes   LIPOMA EXCISION      Social History Albert Cox  reports that he has never smoked. He has never used smokeless tobacco. He reports that he does not currently use alcohol. He reports that he does not use drugs.  family history includes Alcohol abuse in his father; Colon polyps in his brother; Heart disease in his mother.  Allergies  Allergen Reactions   Diltiazem Rash       PHYSICAL EXAMINATION:  Vital signs: BP (!) 151/71    Pulse (!) 52    Temp (!) 97.1 F (36.2 C)    Ht 5\' 11"  (1.803 m)    Wt 178 lb (80.7 kg)    SpO2 98%    BMI 24.83 kg/m  General: Well-developed, well-nourished, no acute distress HEENT: Sclerae are anicteric, conjunctiva pink. Oral mucosa intact Lungs: Clear Heart: Regular Abdomen: soft, nontender, nondistended, no obvious ascites, no peritoneal signs, normal bowel sounds. No organomegaly. Extremities: No edema Psychiatric: alert and oriented x3. Cooperative     ASSESSMENT:  1.  Colorectal neoplasia screening.  Average risk screening colonoscopy 2.   Multicystic lesion of the kidney as described above   PLAN:   1.  Screening colonoscopy 2.  We can help arrange follow-up imaging of the kidney lesion

## 2021-04-22 NOTE — Patient Instructions (Signed)
Thank you for allowing [place] to care for you today! Resume previous diet and medications today. Return to normal daily activities tomorrow, 04/23/21. Recommend next screening colonoscopy in 10 years. Recommend Metamucil 2 tablespoons daily with at least 8 ounces of a liquid.  This will help with more complete elimination. We will call to assist getting you a follow -up MRI to assess the multicystic lesion of the right kidney.    YOU HAD AN ENDOSCOPIC PROCEDURE TODAY AT THE [facility]:   Refer to the procedure report that was given to you for any specific questions about what was found during the examination.  If the procedure report does not answer your questions, please call your gastroenterologist to clarify.  If you requested that your care partner not be given the details of your procedure findings, then the procedure report has been included in a sealed envelope for you to review at your convenience later.  YOU SHOULD EXPECT: Some feelings of bloating in the abdomen. Passage of more gas than usual.  Walking can help get rid of the air that was put into your GI tract during the procedure and reduce the bloating. If you had a lower endoscopy (such as a colonoscopy or flexible sigmoidoscopy) you may notice spotting of blood in your stool or on the toilet paper. If you underwent a bowel prep for your procedure, you may not have a normal bowel movement for a few days.  Please Note:  You might notice some irritation and congestion in your nose or some drainage.  This is from the oxygen used during your procedure.  There is no need for concern and it should clear up in a day or so.  SYMPTOMS TO REPORT IMMEDIATELY:  Following lower endoscopy (colonoscopy or flexible sigmoidoscopy):  Excessive amounts of blood in the stool  Significant tenderness or worsening of abdominal pains  Swelling of the abdomen that is new, acute  Fever of 100F or higher    For urgent or emergent issues, a  gastroenterologist can be reached at any hour by calling [phone]. Do not use MyChart messaging for urgent concerns.    DIET:  We do recommend a small meal at first, but then you may proceed to your regular diet.  Drink plenty of fluids but you should avoid alcoholic beverages for 24 hours.  ACTIVITY:  You should plan to take it easy for the rest of today and you should NOT DRIVE or use heavy machinery until tomorrow (because of the sedation medicines used during the test).    FOLLOW UP: Our staff will call the number listed on your records 48-72 hours following your procedure to check on you and address any questions or concerns that you may have regarding the information given to you following your procedure. If we do not reach you, we will leave a message.  We will attempt to reach you two times.  During this call, we will ask if you have developed any symptoms of COVID 19. If you develop any symptoms (ie: fever, flu-like symptoms, shortness of breath, cough etc.) before then, please call [phone].  If you test positive for Covid 19 in the 2 weeks post procedure, please call and report this information to [place].    If any biopsies were taken you will be contacted by phone or by letter within the next 1-3 weeks.  Please call us at [phone] if you have not heard about the biopsies in 3 weeks.    SIGNATURES/CONFIDENTIALITY:  You and/or your care partner have signed paperwork which will be entered into your electronic medical record.  These signatures attest to the fact that that the information above on your After Visit Summary has been reviewed and is understood.  Full responsibility of the confidentiality of this discharge information lies with you and/or your care-partner.

## 2021-04-22 NOTE — Telephone Encounter (Signed)
Inbound call from patient stated that he has a procedure today and is feeling like he has pressure in the back of his head. Patient is seeking advice if that is normal. Please advise.

## 2021-04-22 NOTE — Op Note (Signed)
[facility] Patient Name: Albert Cox Procedure Date: 04/22/2021 2:08 PM MRN: [id number] Endoscopist: Docia Chuck. Henrene Pastor , MD Age: 66 Referring MD:  Date of Birth: [birth year] Gender: Male Account #: [id number] Procedure:                Colonoscopy Indications:              Colon cancer screening (positive Cologuard test).                            Previous colonoscopy 2012 was negative for neoplasia Medicines:                Monitored Anesthesia Care Procedure:                Pre-Anesthesia Assessment:                           - Prior to the procedure, a History and Physical                            was performed, and patient medications and                            allergies were reviewed. The patient's tolerance of                            previous anesthesia was also reviewed. The risks                            and benefits of the procedure and the sedation                            options and risks were discussed with the patient.                            All questions were answered, and informed consent                            was obtained. Prior Anticoagulants: The patient has                            taken no previous anticoagulant or antiplatelet                            agents. ASA Grade Assessment: II - A patient with                            mild systemic disease. After reviewing the risks                            and benefits, the patient was deemed in                            satisfactory condition to undergo the procedure.  After obtaining informed consent, the colonoscope                            was passed under direct vision. Throughout the                            procedure, the patient's blood pressure, pulse, and                            oxygen saturations were monitored continuously. The                            CF [id number] was introduced through the anus                            and  advanced to the the cecum, identified by                            appendiceal orifice and ileocecal valve. The                            ileocecal valve, appendiceal orifice, and rectum                            were photographed. The quality of the bowel                            preparation was excellent. The colonoscopy was                            performed without difficulty. The patient tolerated                            the procedure well. The bowel preparation used was                            GoLYTELY via split dose instruction. Scope In: 2:19:28 PM Scope Out: 2:29:35 PM Scope Withdrawal Time: 0 hours 8 minutes 1 second  Total Procedure Duration: 0 hours 10 minutes 7 seconds  Findings:                 Multiple diverticula were found in the entire colon.                           The exam was otherwise without abnormality on                            direct and retroflexion views. No significant                            hemorrhoids. Small hypertrophic anal papilla. Complications:            No immediate complications. Estimated blood loss:  None. Estimated Blood Loss:     Estimated blood loss: none. Impression:               1. Diverticulosis in the entire examined colon.                           2. The examination was otherwise normal on direct                            and retroflexion views.                           3. Hypertrophic anal papilla. Difficulty cleaning                            post defecation                           4. Multicystic lesion of the right kidney on                            imaging March 2021. Was to have follow-up imaging                            elsewhere, but did not. Recommendation:           1. Repeat colonoscopy in 10 years for screening                            purposes.                           2. Metamucil 2 tablespoons daily. This will help                            with post defecation  cleaning.                           3. Please schedule MRI of the abdomen "follow-up                            multicystic lesion of right kidney".                           4. Continue present medications and previous diet.                           5. Resume general medical care with PCP Docia Chuck. Henrene Pastor, MD 04/22/2021 2:42:05 PM This report has been signed electronically.

## 2021-04-22 NOTE — Progress Notes (Signed)
VS-CW  Pt's states no medical or surgical changes since previsit or office visit.  

## 2021-04-22 NOTE — Telephone Encounter (Signed)
Called the patient back. He is for colonoscopy this afternoon and is having some nausea and light headedness with the prep. He's yellow liquid with prep results encouraged him to push fluids until his cutoff at 11 am.

## 2021-04-22 NOTE — Progress Notes (Signed)
Report to PACU, RN, vss, BBS= Clear.  

## 2021-04-23 ENCOUNTER — Other Ambulatory Visit: Payer: Self-pay

## 2021-04-23 DIAGNOSIS — N289 Disorder of kidney and ureter, unspecified: Secondary | ICD-10-CM

## 2021-04-26 ENCOUNTER — Telehealth: Payer: Self-pay | Admitting: *Deleted

## 2021-04-26 NOTE — Telephone Encounter (Signed)
°  Follow up Call-  Call back number 04/22/2021  Post procedure Call Back phone  # [phone]  Permission to leave phone message Yes  Some recent data might be hidden     Patient questions:  Do you have a fever, pain , or abdominal swelling? No. Pain Score  0 *  Have you tolerated food without any problems? Yes.    Have you been able to return to your normal activities? Yes.    Do you have any questions about your discharge instructions: Diet   No. Medications  No. Follow up visit  No.  Do you have questions or concerns about your Care? No.  Actions: * If pain score is 4 or above: No action needed, pain <4.

## 2021-05-05 ENCOUNTER — Ambulatory Visit (HOSPITAL_COMMUNITY)
Admission: RE | Admit: 2021-05-05 | Discharge: 2021-05-05 | Disposition: A | Discharge status: Home / Self Care | Payer: Medicare Other | Source: Ambulatory Visit | Attending: Internal Medicine | Admitting: Internal Medicine

## 2021-05-05 ENCOUNTER — Other Ambulatory Visit: Payer: Self-pay

## 2021-05-05 DIAGNOSIS — N289 Disorder of kidney and ureter, unspecified: Secondary | ICD-10-CM | POA: Insufficient documentation

## 2021-05-05 IMAGING — MR MR ABDOMEN WO/W CM
18 series · 48 of 48 positions shown · IV contrast (gadavist)
Comparison: MRI abdomen [DATE]

CLINICAL DATA: Multi-cystic renal lesion follow-up

EXAM:
MRI ABDOMEN WITHOUT AND WITH CONTRAST
TECHNIQUE: Multiplanar multisequence MR imaging of the abdomen was performed
both before and after the administration of intravenous contrast.
CONTRAST:  8mL GADAVIST GADOBUTROL 1 MMOL/ML IV SOLN

[Series 2: DWI · axial · 6.0mm · 1.49mm/px · z∈[-142,+110]mm · 4 of 72 slices shown (1 of 2)]
[im 1/72]
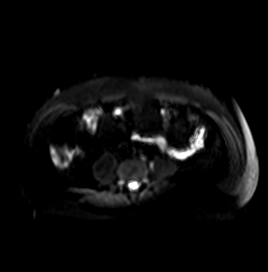
[im 24/72]
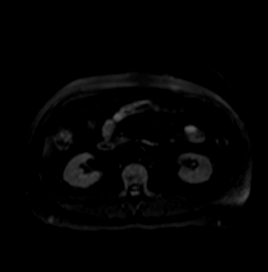
[im 48/72]
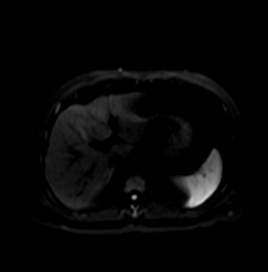
[im 72/72]
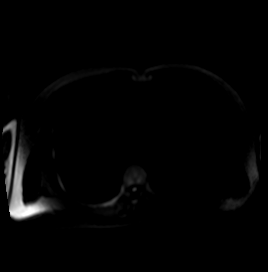

[Series 3: DWI · axial · 6.0mm · 1.49mm/px · z∈[-142,+110]mm · 2 of 36 slices shown (2 of 2)]
[im 1/36]
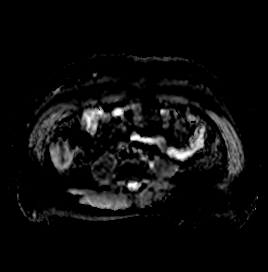
[im 36/36]
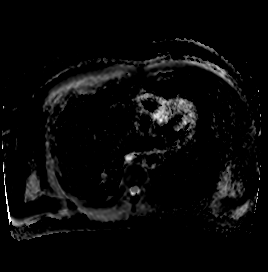

[Series 4: T2 fat-sat · axial · 6.0mm · 1.25mm/px · z∈[-141,+111]mm · 2 of 36 slices shown]
[im 1/36]
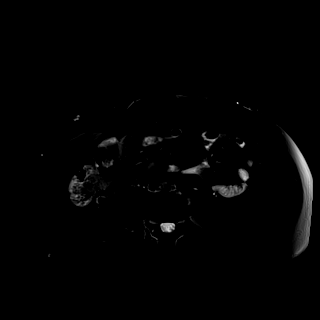
[im 36/36]
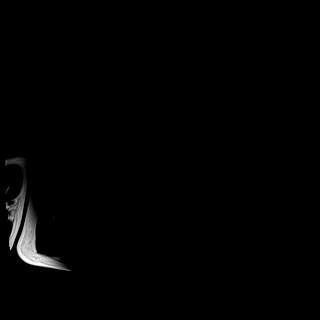

[Series 7: T2 · coronal · 6.0mm · 1.56mm/px · 2 of 35 slices shown (1 of 2)]
[im 1/35]
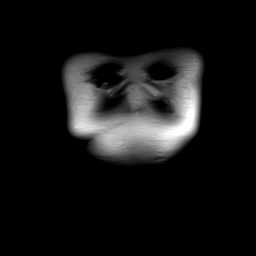
[im 35/35]
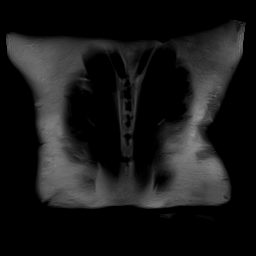

[Series 8: T1 · axial · 3.0mm · 1.25mm/px · z∈[-167,+70]mm · 3 of 80 slices shown (1 of 2)]
[im 1/80]
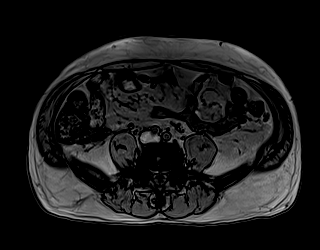
[im 40/80]
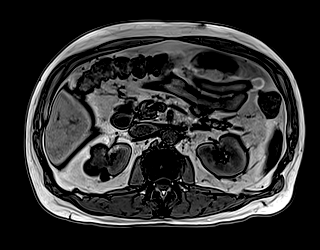
[im 80/80]
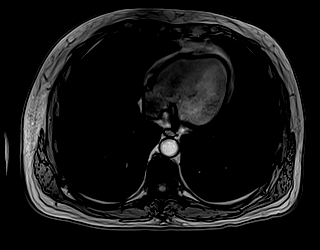

[Series 9: T1 · axial · 3.0mm · 1.25mm/px · z∈[-167,+70]mm · 3 of 80 slices shown (2 of 2)]
[im 1/80]
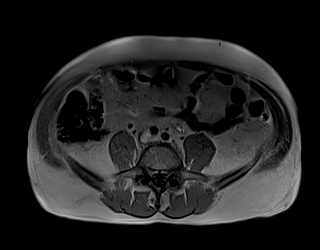
[im 40/80]
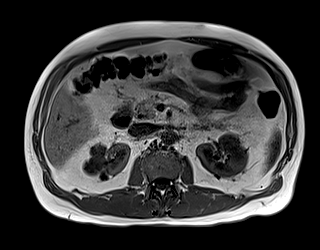
[im 80/80]
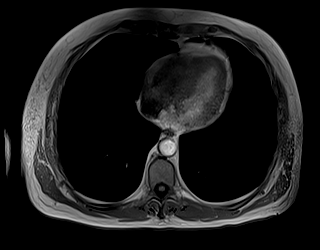

[Series 10: bSSFP · axial · 5.0mm · 0.84mm/px · z∈[-185,+73]mm · 2 of 48 slices shown]
[im 1/48]
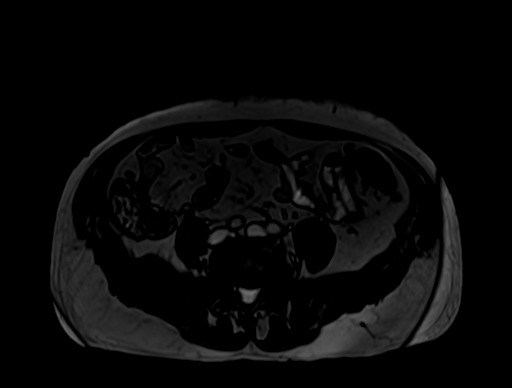
[im 48/48]
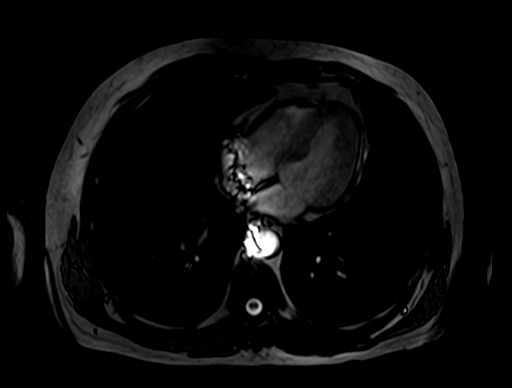

[Series 12: T1 dynamic · axial · 3.0mm · 1.25mm/px · z∈[-172,+65]mm · 3 of 80 slices shown (1 of 10)]
[im 1/80]
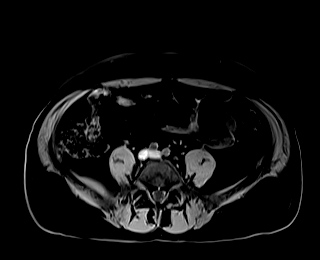
[im 40/80]
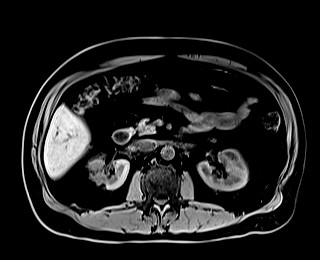
[im 80/80]
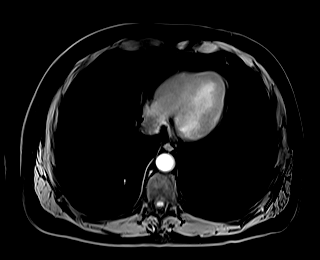

[Series 16: T1 dynamic · axial · 3.0mm · 1.25mm/px · z∈[-172,+65]mm · 3 of 80 slices shown (2 of 10)]
[im 1/80]
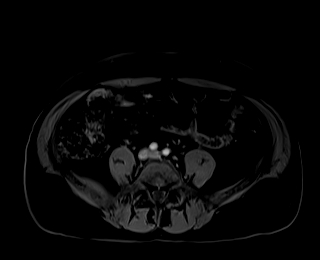
[im 40/80]
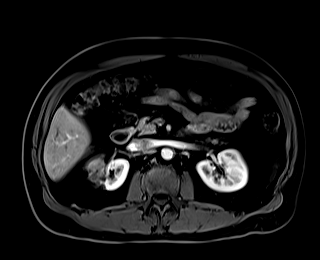
[im 80/80]
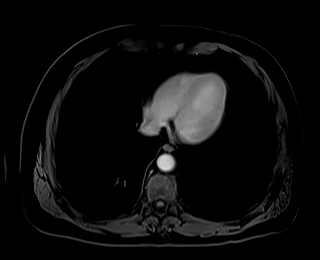

[Series 17: T1 dynamic · axial · 3.0mm · 1.25mm/px · z∈[-172,+65]mm · 3 of 80 slices shown (3 of 10)]
[im 1/80]
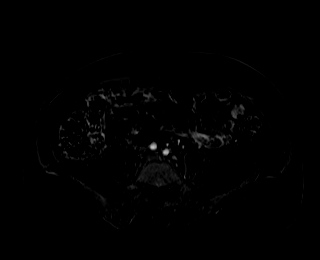
[im 40/80]
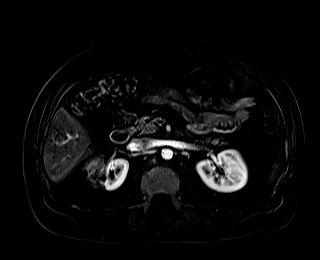
[im 80/80]
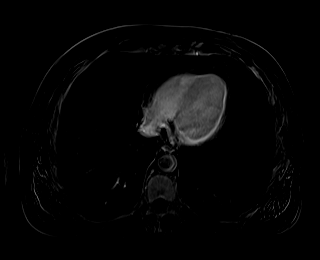

[Series 20: T1 dynamic · axial · 3.0mm · 1.25mm/px · z∈[-172,+65]mm · 3 of 80 slices shown (4 of 10)]
[im 1/80]
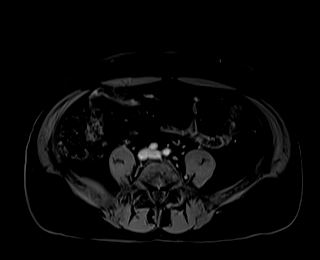
[im 40/80]
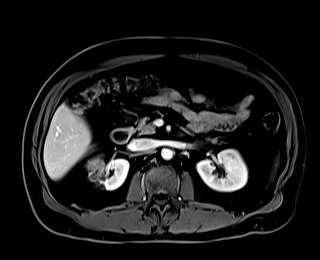
[im 80/80]
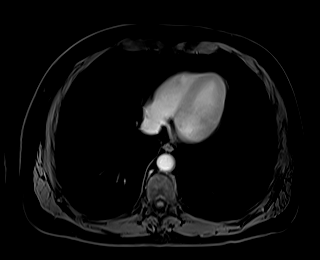

[Series 21: T1 dynamic · axial · 3.0mm · 1.25mm/px · z∈[-172,+65]mm · 3 of 80 slices shown (5 of 10)]
[im 1/80]
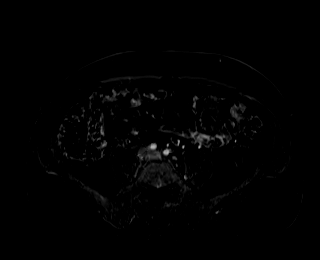
[im 40/80]
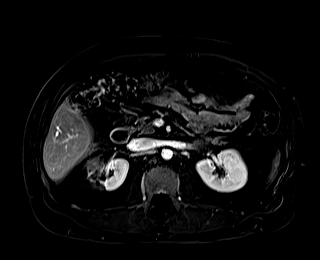
[im 80/80]
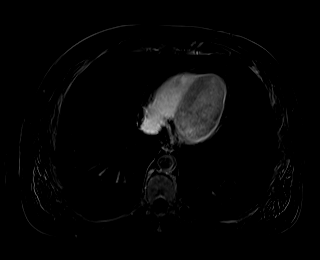

[Series 24: T1 dynamic · axial · 3.0mm · 1.25mm/px · z∈[-172,+65]mm · 3 of 80 slices shown (6 of 10)]
[im 1/80]
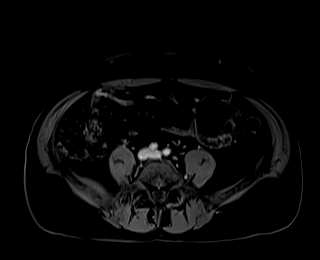
[im 40/80]
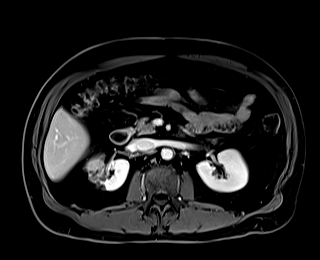
[im 80/80]
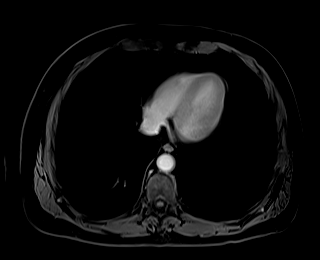

[Series 25: T1 dynamic · axial · 3.0mm · 1.25mm/px · z∈[-172,+65]mm · 3 of 80 slices shown (7 of 10)]
[im 1/80]
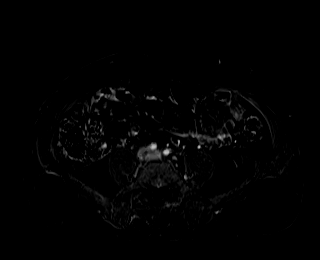
[im 40/80]
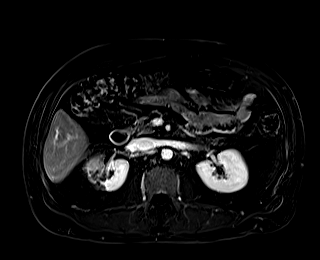
[im 80/80]
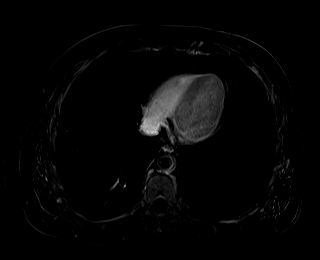

[Series 27: T1 dynamic · coronal · 5.0mm · 1.41mm/px · 2 of 56 slices shown (8 of 10)]
[im 1/56]
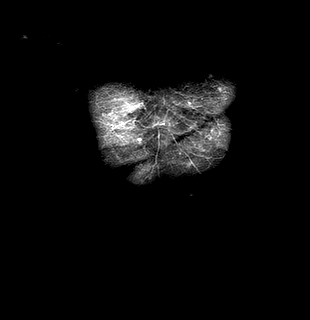
[im 56/56]
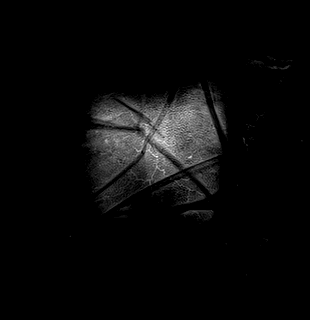

[Series 28: T2 · axial · 6.0mm · 1.56mm/px · 1 of 35 slices shown (2 of 2)]
[im 1/35]
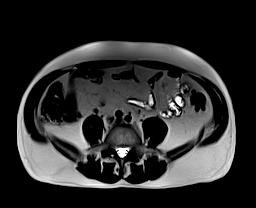

[Series 31: T1 dynamic · axial · 3.0mm · 1.25mm/px · z∈[-172,+65]mm · 3 of 80 slices shown (9 of 10)]
[im 1/80]
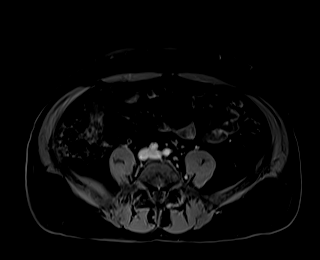
[im 40/80]
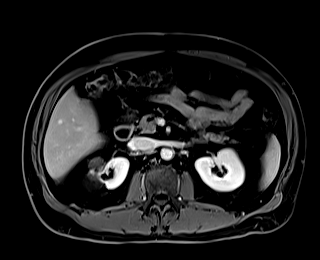
[im 80/80]
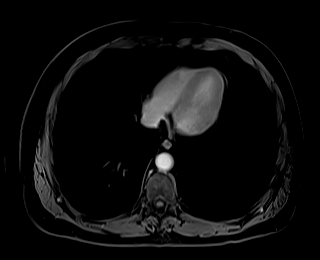

[Series 32: T1 dynamic · axial · 3.0mm · 1.25mm/px · z∈[-172,+65]mm · 3 of 80 slices shown (10 of 10)]
[im 1/80]
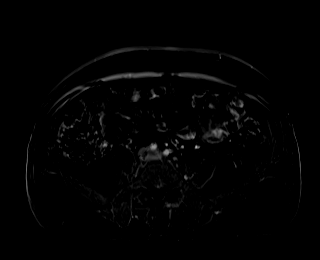
[im 40/80]
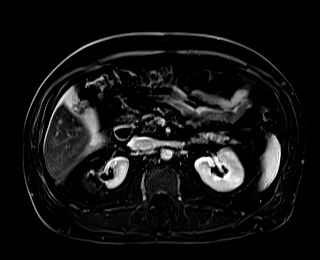
[im 80/80]
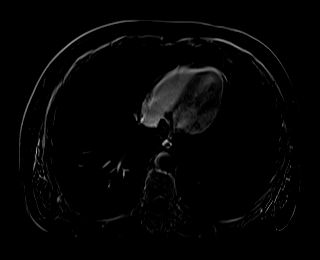

[48 of 48 positions shown; findings below may reference images not displayed]

FINDINGS: Lower chest: No acute findings.

Hepatobiliary: No mass or other parenchymal abnormality identified.

Pancreas: No mass, inflammatory changes, or other parenchymal
abnormality identified.

Spleen:  Within normal limits in size and appearance.

Adrenals/Urinary Tract: Adrenal glands appear normal. Similar size
and appearance of a complex multi-cystic lesion at the superior pole
of the right kidney measuring up to 4.8 x 2.8 x 3.3 cm in AP,
transverse and craniocaudal dimensions. Adjacent dystrophic
calcifications and area of cortical scarring appear grossly similar.
No new suspicious nodular enhancement visualized. No additional
renal lesions identified. No hydronephrosis.

Stomach/Bowel: Colonic diverticulosis. No evidence of bowel
obstruction.

Vascular/Lymphatic: No pathologically enlarged lymph nodes
identified. No abdominal aortic aneurysm demonstrated.

Other: No ascites. A small umbilical hernia containing fat is noted.

Musculoskeletal: No suspicious bony lesions identified.
IMPRESSION: 1. Stable size and appearance of the complex multicystic lesion in
the upper right kidney. No new suspicious nodularity or enhancement
identified.
2. Colonic diverticulosis.

## 2021-05-05 MED ORDER — GADOBUTROL 1 MMOL/ML IV SOLN
8.0000 mL | Freq: Once | INTRAVENOUS | Status: AC | PRN
  Administered 2021-05-05: 8 mL via INTRAVENOUS

## 2022-04-01 ENCOUNTER — Ambulatory Visit: Payer: Medicare HMO | Admitting: Gastroenterology

## 2022-04-01 ENCOUNTER — Encounter: Payer: Self-pay | Admitting: Gastroenterology

## 2022-04-01 VITALS — BP 150/90 | HR 62 | Ht 70.0 in | Wt 184.1 lb

## 2022-04-01 DIAGNOSIS — K219 Gastro-esophageal reflux disease without esophagitis: Secondary | ICD-10-CM

## 2022-04-01 DIAGNOSIS — R1084 Generalized abdominal pain: Secondary | ICD-10-CM

## 2022-04-01 DIAGNOSIS — R1032 Left lower quadrant pain: Secondary | ICD-10-CM

## 2022-04-01 MED ORDER — HYOSCYAMINE SULFATE 0.125 MG SL SUBL: 0.1250 mg | SUBLINGUAL_TABLET | SUBLINGUAL | 2 refills | Status: DC | PRN

## 2022-04-01 NOTE — Progress Notes (Signed)
04/01/2022 Brentt Fread [id number] 09/27/1955   HISTORY OF PRESENT ILLNESS: ***         Past Medical History:  Diagnosis Date   Allergy    GERD (gastroesophageal reflux disease)    Hyperlipidemia    Hypertension    Past Surgical History:  Procedure Laterality Date   ARTHROSCOPY KNEE W/ DRILLING     lt. knee   COLONOSCOPY  08/18/2010   Dr.[name] -divericulosis   coreneal transplants     both eyes   LIPOMA EXCISION      reports that he has never smoked. He has never used smokeless tobacco. He reports that he does not currently use alcohol. He reports that he does not use drugs. family history includes Alcohol abuse in his father; Colon polyps in his brother; Heart disease in his mother. Allergies  Allergen Reactions   Diltiazem Rash      Outpatient Encounter Medications as of 04/01/2022  Medication Sig   aspirin 81 MG tablet Take 81 mg by mouth daily.     B Complex Vitamins (VITAMIN B COMPLEX PO) Take by mouth.   Calcium Carbonate-Vitamin D (CALCIUM-VITAMIN D) 500-200 MG-UNIT per tablet Take 1 tablet by mouth 3 (three) times a week.     Cholecalciferol (VITAMIN D-1000 MAX ST) 25 MCG (1000 UT) tablet Take 1,000 Units by mouth daily.   CINNAMON PO Take by mouth.   clobetasol cream (TEMOVATE) 3.71 % Apply 1 Application topically 2 (two) times daily.   cyanocobalamin 100 MCG tablet Take 1 tablet by mouth daily.   diclofenac Sodium (VOLTAREN) 1 % GEL Apply topically.   ezetimibe (ZETIA) 10 MG tablet Take 10 mg by mouth daily.   fish oil-omega-3 fatty acids 1000 MG capsule Take 1 g by mouth 3 (three) times a week.     GARLIC PO Take by mouth.   hydrALAZINE (APRESOLINE) 50 MG tablet Take 75 mg by mouth 2 (two) times daily.   ibuprofen (ADVIL) 800 MG tablet Take 800 mg by mouth every 8 (eight) hours as needed.   ketoconazole (NIZORAL) 2 % shampoo Apply 1 Application topically 2 (two) times a week.   NON FORMULARY CBD oil po   pantoprazole (PROTONIX) 40 MG tablet Take  40 mg by mouth daily.   Saw Palmetto, Serenoa repens, (SAW PALMETTO PO) Take by mouth.   TURMERIC PO Take by mouth.   vitamin C (ASCORBIC ACID) 500 MG tablet Take 500 mg by mouth daily.     BYSTOLIC 10 MG tablet Take 10 mg by mouth daily. (Patient not taking: Reported on 04/01/2022)   [DISCONTINUED] omeprazole (PRILOSEC) 40 MG capsule Take 40 mg by mouth 2 (two) times daily. (Patient not taking: Reported on 04/01/2022)   No facility-administered encounter medications on file as of 04/01/2022.     REVIEW OF SYSTEMS  : All other systems reviewed and negative except where noted in the History of Present Illness.   PHYSICAL EXAM: BP (!) 150/90   Pulse 62   Ht 5\' 10"  (1.778 m)   Wt 184 lb 2 oz (83.5 kg)   BMI 26.42 kg/m  General: Well developed white male in no acute distress Head: Normocephalic and atraumatic Eyes:  sclerae anicteric,conjunctive pink. Ears: Normal auditory acuity Neck: Supple, no masses.  Lungs: Clear throughout to auscultation Heart: Regular rate and rhythm Abdomen: Soft, nontender, non distended. No masses or hepatomegaly noted. Normal bowel sounds Rectal: *** Musculoskeletal: Symmetrical with no gross deformities  Skin: No lesions on visible extremities Extremities:  No edema  Neurological: Alert oriented x 4, grossly nonfocal Cervical Nodes:  No significant cervical adenopathy Psychological:  Alert and cooperative. Normal mood and affect  ASSESSMENT AND PLAN:    CC:  Emelda Fear, DO

## 2022-04-01 NOTE — Patient Instructions (Addendum)
It has been recommended to you by your physician that you have a(n) Endoscopy completed. Per your request, we did not schedule the procedure(s) today. Please contact our office at [phone] should you decide to have the procedure completed. You will be scheduled for a pre-visit and procedure at that time.  We have sent the following medications to your pharmacy for you to pick up at your convenience:   Levsin 0.125 mg take 1 tablet under the tongue ever 4-6 hours as needed  If your blood pressure at your visit was 140/90 or greater, please contact your primary care physician to follow up on this.  _______________________________________________________  If you are age 67 or older, your body mass index should be between 23-30. Your Body mass index is 26.42 kg/m. If this is out of the aforementioned range listed, please consider follow up with your Primary Care Provider.  If you are age 56 or younger, your body mass index should be between 19-25. Your Body mass index is 26.42 kg/m. If this is out of the aformentioned range listed, please consider follow up with your Primary Care Provider.   ________________________________________________________  The [facility] providers would like to encourage you to use [facility] to communicate with providers for non-urgent requests or questions.  Due to long hold times on the telephone, sending your provider a message by [facility] may be a faster and more efficient way to get a response.  Please allow 48 business hours for a response.  Please remember that this is for non-urgent requests.    Thank you for entrusting me with your care and choosing [facility].  Alonza Bogus PA-C

## 2022-04-05 ENCOUNTER — Encounter: Payer: Self-pay | Admitting: Gastroenterology

## 2022-04-05 DIAGNOSIS — R1032 Left lower quadrant pain: Secondary | ICD-10-CM | POA: Insufficient documentation

## 2022-04-05 DIAGNOSIS — R1084 Generalized abdominal pain: Secondary | ICD-10-CM | POA: Insufficient documentation

## 2022-04-05 DIAGNOSIS — K219 Gastro-esophageal reflux disease without esophagitis: Secondary | ICD-10-CM | POA: Insufficient documentation

## 2022-04-05 NOTE — Progress Notes (Signed)
Noted  

## 2022-05-10 ENCOUNTER — Ambulatory Visit: Payer: Medicare Other | Admitting: Internal Medicine

## 2022-05-16 ENCOUNTER — Encounter: Payer: Medicare HMO | Admitting: Internal Medicine

## 2022-06-01 ENCOUNTER — Ambulatory Visit: Payer: Medicare HMO | Admitting: Internal Medicine

## 2022-06-01 ENCOUNTER — Encounter: Payer: Self-pay | Admitting: Internal Medicine

## 2022-06-01 VITALS — BP 142/76 | HR 64 | Ht 71.0 in | Wt 187.0 lb

## 2022-06-01 DIAGNOSIS — R1310 Dysphagia, unspecified: Secondary | ICD-10-CM | POA: Diagnosis not present

## 2022-06-01 DIAGNOSIS — R1032 Left lower quadrant pain: Secondary | ICD-10-CM

## 2022-06-01 DIAGNOSIS — K219 Gastro-esophageal reflux disease without esophagitis: Secondary | ICD-10-CM | POA: Diagnosis not present

## 2022-06-01 DIAGNOSIS — K5732 Diverticulitis of large intestine without perforation or abscess without bleeding: Secondary | ICD-10-CM

## 2022-06-01 MED ORDER — HYOSCYAMINE SULFATE 0.125 MG SL SUBL: 0.1250 mg | SUBLINGUAL_TABLET | SUBLINGUAL | 3 refills | Status: DC | PRN

## 2022-06-01 NOTE — Patient Instructions (Addendum)
You have been scheduled for an endoscopy. Please follow written instructions given to you at your visit today. If you use inhalers (even only as needed), please bring them with you on the day of your procedure.  We have sent the following medications to your pharmacy for you to pick up at your convenience: Levsin   _______________________________________________________  If your blood pressure at your visit was 140/90 or greater, please contact your primary care physician to follow up on this.  _______________________________________________________  If you are age 84 or older, your body mass index should be between 23-30. Your Body mass index is 26.08 kg/m. If this is out of the aforementioned range listed, please consider follow up with your Primary Care Provider.  If you are age 58 or younger, your body mass index should be between 19-25. Your Body mass index is 26.08 kg/m. If this is out of the aformentioned range listed, please consider follow up with your Primary Care Provider.   ________________________________________________________  The [facility] providers would like to encourage you to use [facility] to communicate with providers for non-urgent requests or questions.  Due to long hold times on the telephone, sending your provider a message by [facility] may be a faster and more efficient way to get a response.  Please allow 48 business hours for a response.  Please remember that this is for non-urgent requests.  _______________________________________________________ It was a pleasure to see you today!  Thank you for trusting me with your gastrointestinal care!

## 2022-06-01 NOTE — Progress Notes (Signed)
HISTORY OF PRESENT ILLNESS:  Albert Cox is a 67 y.o. male with past medical history as listed below today regarding problems with left lower quadrant pain, diverticular disease, and reflux disease.  I last saw the patient March 2021 regarding left lower quadrant pain and screening colonoscopy.  See that dictation for details.  He subsequently underwent a CT scan of the abdomen and pelvis.  No cause for pain found.  No diverticulitis.  Incidental right kidney cystic lesion was followed up the MRI that month and 2 years later.  Now being followed by PCP.  His colonoscopy was performed April 22, 2021 (positive Cologuard testing).  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  Hypertrophic anal papilla.  Otherwise normal exam.  Seen most recently by GI physician assistant April 01, 2022 regarding left quadrant pain.  Not felt to be diverticulitis.  Possibly diverticular spasm.  Treated with sublingual Levsin.  Patient tells me that he continues with some intermittent left lower quadrant discomfort.  Sometimes it feels like a Albert Cox.  Sometimes it feels like heat.  Always last less than an hour.  It may happen a few times per week.  He does take stool softener at night.  Has 1 bowel movement daily.  Next, he was having chronic problems with throat clearing.  He was saw an ENT who suggested GERD after laryngoscopy.  He was placed on pantoprazole.  Since that time, previous reflux symptoms have resolved.  He was also having some intermittent solid food dysphagia which improved.  Endoscopy was previously recommended.  He declined.  Continues on pantoprazole 40 mg daily.  Still with some throat clearing behavior in the morning.  He is accompanied today by his wife.  GI review of systems is otherwise negative  REVIEW OF SYSTEMS:  All non-GI ROS negative entirely  Past Medical History:  Diagnosis Date   Allergy    GERD (gastroesophageal reflux disease)    Hyperlipidemia    Hypertension     Past  Surgical History:  Procedure Laterality Date   ARTHROSCOPY KNEE W/ DRILLING     lt. knee   COLONOSCOPY  08/18/2010   Dr.Anikka Cox -divericulosis   coreneal transplants     both eyes   LIPOMA EXCISION      Social History Albert Cox  reports that he has never smoked. He has never used smokeless tobacco. He reports that he does not currently use alcohol. He reports that he does not use drugs.  family history includes Alcohol abuse in his father; Colon polyps in his brother; Heart disease in his mother.  Allergies  Allergen Reactions   Diltiazem Rash       PHYSICAL EXAMINATION: Vital signs: BP (!) 142/76   Pulse 64   Ht 5\' 11"  (1.803 m)   Wt 187 lb (84.8 kg)   BMI 26.08 kg/m   Constitutional: generally well-appearing, no acute distress Psychiatric: alert and oriented x3, cooperative Eyes: extraocular movements intact, anicteric, conjunctiva pink Mouth: oral pharynx moist, no lesions Neck: supple no lymphadenopathy Cardiovascular: heart regular rate and rhythm, no murmur Lungs: clear to auscultation bilaterally Abdomen: soft, nontender, nondistended, no obvious ascites, no peritoneal signs, normal bowel sounds, no organomegaly Rectal: Omitted Extremities: no clubbing, cyanosis, or lower extremity edema bilaterally Skin: no lesions on visible extremities Neuro: No focal deficits.  Cranial nerves intact  ASSESSMENT:  1.  Intermittent left lower quadrant discomfort consistent with benign diverticular spasm. 2.  Colonoscopy February 2023 with pandiverticulosis 3.  Chronic GERD.  Symptoms improved with PPI  4.  Intermittent solid food dysphagia.  Improved on PPI 5.  Chronic throat clearing behavior   PLAN:  1.  High-fiber diet 2.  Reassurance 3.  Discussion on diverticular disease 4.  Keep bowels regulated 5.  Levsin sublingual as needed.  Prescription refilled.  Medication effects and side effects reviewed. 6.  Continue pantoprazole daily 7.  Schedule upper endoscopy  to rule out Barrett's in a patient with chronic reflux disease and prior issues with dysphagia.The nature of the procedure, as well as the risks, benefits, and alternatives were carefully and thoroughly reviewed with the patient. Ample time for discussion and questions allowed. The patient understood, was satisfied, and agreed to proceed.  8.  Repeat screening colonoscopy around 2033 Total time of 40 minutes was spent preparing to see the patient, reviewing data, obtaining comprehensive history, performing medically appropriate physical examination, counseling and educating the patient regarding multiple above listed issues, ordering medication, ordering endoscopic procedure, and documenting clinical information in the health record.

## 2022-06-02 ENCOUNTER — Telehealth: Payer: Self-pay | Admitting: Internal Medicine

## 2022-06-02 MED ORDER — HYOSCYAMINE SULFATE 0.125 MG SL SUBL: 0.1250 mg | SUBLINGUAL_TABLET | SUBLINGUAL | 3 refills | Status: DC | PRN

## 2022-06-02 NOTE — Telephone Encounter (Signed)
Inbound call from patient , stated that he went to pharmacy to pick up medication hyoscyamine  and pharmacy don't have any prescription .Please advise

## 2022-06-02 NOTE — Telephone Encounter (Signed)
Levsin refilled.   

## 2022-06-08 ENCOUNTER — Ambulatory Visit (AMBULATORY_SURGERY_CENTER): Payer: Medicare HMO | Admitting: Internal Medicine

## 2022-06-08 ENCOUNTER — Encounter: Payer: Self-pay | Admitting: Internal Medicine

## 2022-06-08 VITALS — BP 151/80 | HR 48 | Temp 96.8°F | Resp 10 | Ht 71.0 in | Wt 187.0 lb

## 2022-06-08 DIAGNOSIS — R1310 Dysphagia, unspecified: Secondary | ICD-10-CM

## 2022-06-08 DIAGNOSIS — K219 Gastro-esophageal reflux disease without esophagitis: Secondary | ICD-10-CM | POA: Diagnosis not present

## 2022-06-08 MED ORDER — SODIUM CHLORIDE 0.9 % IV SOLN: 500.0000 mL | Freq: Once | INTRAVENOUS | Status: DC

## 2022-06-08 NOTE — Progress Notes (Signed)
HISTORY OF PRESENT ILLNESS:   Albert Cox is a 67 y.o. male with past medical history as listed below today regarding problems with left lower quadrant pain, diverticular disease, and reflux disease.   I last saw the patient March 2021 regarding left lower quadrant pain and screening colonoscopy.  See that dictation for details.  He subsequently underwent a CT scan of the abdomen and pelvis.  No cause for pain found.  No diverticulitis.  Incidental right kidney cystic lesion was followed up the MRI that month and 2 years later.  Now being followed by PCP.  His colonoscopy was performed April 22, 2021 (positive Cologuard testing).  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  Hypertrophic anal papilla.  Otherwise normal exam.   Seen most recently by GI physician assistant April 01, 2022 regarding left quadrant pain.  Not felt to be diverticulitis.  Possibly diverticular spasm.  Treated with sublingual Levsin.  Patient tells me that he continues with some intermittent left lower quadrant discomfort.  Sometimes it feels like a Dance movement psychotherapist.  Sometimes it feels like heat.  Always last less than an hour.  It may happen a few times per week.  He does take stool softener at night.  Has 1 bowel movement daily.   Next, he was having chronic problems with throat clearing.  He was saw an ENT who suggested GERD after laryngoscopy.  He was placed on pantoprazole.  Since that time, previous reflux symptoms have resolved.  He was also having some intermittent solid food dysphagia which improved.  Endoscopy was previously recommended.  He declined.  Continues on pantoprazole 40 mg daily.  Still with some throat clearing behavior in the morning.  He is accompanied today by his wife.   GI review of systems is otherwise negative   REVIEW OF SYSTEMS:   All non-GI ROS negative entirely       Past Medical History:  Diagnosis Date   Allergy     GERD (gastroesophageal reflux disease)     Hyperlipidemia     Hypertension              Past Surgical History:  Procedure Laterality Date   ARTHROSCOPY KNEE W/ DRILLING        lt. knee   COLONOSCOPY   08/18/2010    Dr.Sinead Hockman -divericulosis   coreneal transplants        both eyes   LIPOMA EXCISION          Social History Saladin Lafoy  reports that he has never smoked. He has never used smokeless tobacco. He reports that he does not currently use alcohol. He reports that he does not use drugs.   family history includes Alcohol abuse in his father; Colon polyps in his brother; Heart disease in his mother.       Allergies  Allergen Reactions   Diltiazem Rash          PHYSICAL EXAMINATION: Vital signs: BP (!) 142/76   Pulse 64   Ht 5\' 11"  (1.803 m)   Wt 187 lb (84.8 kg)   BMI 26.08 kg/m   Constitutional: generally well-appearing, no acute distress Psychiatric: alert and oriented x3, cooperative Eyes: extraocular movements intact, anicteric, conjunctiva pink Mouth: oral pharynx moist, no lesions Neck: supple no lymphadenopathy Cardiovascular: heart regular rate and rhythm, no murmur Lungs: clear to auscultation bilaterally Abdomen: soft, nontender, nondistended, no obvious ascites, no peritoneal signs, normal bowel sounds, no organomegaly Rectal: Omitted Extremities: no clubbing, cyanosis, or lower extremity edema bilaterally Skin:  no lesions on visible extremities Neuro: No focal deficits.  Cranial nerves intact   ASSESSMENT:   1.  Intermittent left lower quadrant discomfort consistent with benign diverticular spasm. 2.  Colonoscopy February 2023 with pandiverticulosis 3.  Chronic GERD.  Symptoms improved with PPI 4.  Intermittent solid food dysphagia.  Improved on PPI 5.  Chronic throat clearing behavior     PLAN:   1.  High-fiber diet 2.  Reassurance 3.  Discussion on diverticular disease 4.  Keep bowels regulated 5.  Levsin sublingual as needed.  Prescription refilled.  Medication effects and side effects reviewed. 6.  Continue  pantoprazole daily 7.  Schedule upper endoscopy to rule out Barrett's in a patient with chronic reflux disease and prior issues with dysphagia.The nature of the procedure, as well as the risks, benefits, and alternatives were carefully and thoroughly reviewed with the patient. Ample time for discussion and questions allowed. The patient understood, was satisfied, and agreed to proceed.  8.  Repeat screening colonoscopy around 2033

## 2022-06-08 NOTE — Op Note (Signed)
[facility] Patient Name: Albert Cox Procedure Date: 06/08/2022 10:46 AM MRN: [id number] Endoscopist: Albert Cox. Albert Cox , MD, [id number] Age: 67 Referring MD:  Date of Birth: 1955-10-15 Gender: Male Account #: [id number] Procedure:                Upper GI endoscopy Indications:              Dysphagia, longstanding esophageal reflux. All                            symptoms improved on regular PPI therapy Medicines:                Monitored Anesthesia Care Procedure:                Pre-Anesthesia Assessment:                           - Prior to the procedure, a History and Physical                            was performed, and patient medications and                            allergies were reviewed. The patient's tolerance of                            previous anesthesia was also reviewed. The risks                            and benefits of the procedure and the sedation                            options and risks were discussed with the patient.                            All questions were answered, and informed consent                            was obtained. Prior Anticoagulants: The patient has                            taken no anticoagulant or antiplatelet agents. ASA                            Grade Assessment: II - A patient with mild systemic                            disease. After reviewing the risks and benefits,                            the patient was deemed in satisfactory condition to                            undergo the procedure.  After obtaining informed consent, the endoscope was                            passed under direct vision. Throughout the                            procedure, the patient's blood pressure, pulse, and                            oxygen saturations were monitored continuously. The                            Olympus scope [id number] was introduced through the                            mouth, and  advanced to the second part of duodenum.                            The upper GI endoscopy was accomplished without                            difficulty. The patient tolerated the procedure                            well. Scope In: Scope Out: Findings:                 The esophagus was normal.                           The stomach was normal. Small hiatal hernia.                           The examined duodenum was normal.                           The cardia and gastric fundus were normal on                            retroflexion. Complications:            No immediate complications. Estimated Blood Loss:     Estimated blood loss: none. Impression:               - Normal esophagus.                           - Normal stomach.                           - Normal examined duodenum.                           - No specimens collected. Recommendation:           - Patient has a contact number available for  emergencies. The signs and symptoms of potential                            delayed complications were discussed with the                            patient. Return to normal activities tomorrow.                            Written discharge instructions were provided to the                            patient.                           - Resume previous diet.                           - Continue present medications. Albert BonitoJohn N. Albert GoodellPerry, MD 06/08/2022 11:07:12 AM This report has been signed electronically.

## 2022-06-08 NOTE — Patient Instructions (Addendum)
Resume all of your previous medications today.  YOU HAD AN ENDOSCOPIC PROCEDURE TODAY AT THE [facility]:   Refer to the procedure report that was given to you for any specific questions about what was found during the examination.  If the procedure report does not answer your questions, please call your gastroenterologist to clarify.  If you requested that your care partner not be given the details of your procedure findings, then the procedure report has been included in a sealed envelope for you to review at your convenience later.  YOU SHOULD EXPECT: Some feelings of bloating in the abdomen. Passage of more gas than usual.  Walking can help get rid of the air that was put into your GI tract during the procedure and reduce the bloating.   Please Note:  You might notice some irritation and congestion in your nose or some drainage.  This is from the oxygen used during your procedure.  There is no need for concern and it should clear up in a day or so.  SYMPTOMS TO REPORT IMMEDIATELY:   Following upper endoscopy (EGD)  Vomiting of blood or coffee ground material  New chest pain or pain under the shoulder blades  Painful or persistently difficult swallowing  New shortness of breath  Fever of 100F or higher  Black, tarry-looking stools  For urgent or emergent issues, a gastroenterologist can be reached at any hour by calling [phone]. Do not use MyChart messaging for urgent concerns.    DIET:  We do recommend a small meal at first, but then you may proceed to your regular diet.  Drink plenty of fluids but you should avoid alcoholic beverages for 24 hours.  ACTIVITY:  You should plan to take it easy for the rest of today and you should NOT DRIVE or use heavy machinery until tomorrow (because of the sedation medicines used during the test).    FOLLOW UP: Our staff will call the number listed on your records the next business day following your procedure.  We will call  around 7:15- 8:00 am to check on you and address any questions or concerns that you may have regarding the information given to you following your procedure. If we do not reach you, we will leave a message.      SIGNATURES/CONFIDENTIALITY: You and/or your care partner have signed paperwork which will be entered into your electronic medical record.  These signatures attest to the fact that that the information above on your After Visit Summary has been reviewed and is understood.  Full responsibility of the confidentiality of this discharge information lies with you and/or your care-partner.

## 2022-06-08 NOTE — Progress Notes (Signed)
Sedate, gd SR, tolerated procedure well, VSS, report to RN 

## 2022-06-09 ENCOUNTER — Telehealth: Payer: Self-pay | Admitting: *Deleted

## 2022-06-09 NOTE — Telephone Encounter (Signed)
Patient is returning your call, please call him back at his house # [phone].Thanks

## 2022-06-09 NOTE — Telephone Encounter (Signed)
Pt states he is doing fine.

## 2022-06-09 NOTE — Telephone Encounter (Signed)
No answer on follow up call. Voice mail not set up. Unable to leave message

## 2022-11-08 ENCOUNTER — Other Ambulatory Visit (HOSPITAL_COMMUNITY): Payer: Self-pay | Admitting: Family Medicine

## 2022-11-08 DIAGNOSIS — N281 Cyst of kidney, acquired: Secondary | ICD-10-CM

## 2022-11-10 ENCOUNTER — Ambulatory Visit (HOSPITAL_COMMUNITY)
Admission: RE | Admit: 2022-11-10 | Discharge: 2022-11-10 | Disposition: A | Discharge status: Home / Self Care | Payer: Medicare HMO | Source: Ambulatory Visit | Attending: Family Medicine | Admitting: Family Medicine

## 2022-11-10 DIAGNOSIS — N281 Cyst of kidney, acquired: Secondary | ICD-10-CM | POA: Insufficient documentation

## 2023-12-28 ENCOUNTER — Telehealth: Payer: Self-pay | Admitting: Internal Medicine

## 2023-12-28 MED ORDER — HYOSCYAMINE SULFATE 0.125 MG SL SUBL: 0.1250 mg | SUBLINGUAL_TABLET | SUBLINGUAL | 3 refills | Status: AC | PRN

## 2023-12-28 NOTE — Telephone Encounter (Signed)
 Levsin refilled.

## 2023-12-28 NOTE — Telephone Encounter (Signed)
 Inbound call from patient stating he is in need of refill on hyoscyamine  medication. Patient is scheduled for an office visit on 02/13/24. Please advise  Thank you

## 2024-02-13 ENCOUNTER — Encounter: Payer: Self-pay | Admitting: Nurse Practitioner

## 2024-02-13 ENCOUNTER — Other Ambulatory Visit

## 2024-02-13 ENCOUNTER — Ambulatory Visit: Admitting: Nurse Practitioner

## 2024-02-13 VITALS — BP 152/80 | HR 56 | Ht 68.75 in | Wt 183.1 lb

## 2024-02-13 DIAGNOSIS — K5790 Diverticulosis of intestine, part unspecified, without perforation or abscess without bleeding: Secondary | ICD-10-CM | POA: Diagnosis not present

## 2024-02-13 DIAGNOSIS — R1032 Left lower quadrant pain: Secondary | ICD-10-CM

## 2024-02-13 DIAGNOSIS — K5730 Diverticulosis of large intestine without perforation or abscess without bleeding: Secondary | ICD-10-CM

## 2024-02-13 LAB — CBC WITH DIFFERENTIAL/PLATELET
Basophils Absolute: 0 K/uL (ref 0.0–0.1)
Basophils Relative: 0.6 % (ref 0.0–3.0)
Eosinophils Absolute: 0.1 K/uL (ref 0.0–0.7)
Eosinophils Relative: 1.2 % (ref 0.0–5.0)
HCT: 39.3 % (ref 39.0–52.0)
Hemoglobin: 13.3 g/dL (ref 13.0–17.0)
Lymphocytes Relative: 33.7 % (ref 12.0–46.0)
Lymphs Abs: 2.3 K/uL (ref 0.7–4.0)
MCHC: 33.9 g/dL (ref 30.0–36.0)
MCV: 88.4 fl (ref 78.0–100.0)
Monocytes Absolute: 0.6 K/uL (ref 0.1–1.0)
Monocytes Relative: 9.2 % (ref 3.0–12.0)
Neutro Abs: 3.8 K/uL (ref 1.4–7.7)
Neutrophils Relative %: 55.3 % (ref 43.0–77.0)
Platelets: 308 K/uL (ref 150.0–400.0)
RBC: 4.45 Mil/uL (ref 4.22–5.81)
RDW: 14 % (ref 11.5–15.5)
WBC: 6.9 K/uL (ref 4.0–10.5)

## 2024-02-13 LAB — COMPREHENSIVE METABOLIC PANEL WITH GFR
ALT: 10 U/L (ref 3–53)
AST: 12 U/L (ref 5–37)
Albumin: 4.4 g/dL (ref 3.5–5.2)
Alkaline Phosphatase: 76 U/L (ref 39–117)
BUN: 17 mg/dL (ref 6–23)
CO2: 29 meq/L (ref 19–32)
Calcium: 9.1 mg/dL (ref 8.4–10.5)
Chloride: 104 meq/L (ref 96–112)
Creatinine, Ser: 1.02 mg/dL (ref 0.40–1.50)
GFR: 75.4 mL/min (ref >60.00)
Glucose, Bld: 104 mg/dL — ABNORMAL HIGH (ref 70–99)
Potassium: 3.9 meq/L (ref 3.5–5.1)
Sodium: 139 meq/L (ref 135–145)
Total Bilirubin: 0.6 mg/dL (ref 0.2–1.2)
Total Protein: 6.9 g/dL (ref 6.0–8.3)

## 2024-02-13 NOTE — Progress Notes (Signed)
 Noted. If the complaint continues, consider contrast CT of the abdomen and pelvis. Thanks

## 2024-02-13 NOTE — Progress Notes (Signed)
 02/13/2024 Albert Cox [id number] 1955/06/22   Chief Complaint: LLQ pain   History of Present Illness: Albert Cox is a 68 year old male with a past medical history of hypertension, hyperlipidemia and diverticulosis.  He was last seen in office by Dr. Raeann for further evaluation regarding intermittent LLQ pain thought to be due to diverticular spasms with symptom relief after taking Hyoscyamine .  At that time, it was also noted he had chronic GERD symptoms, treated with pantoprazole 40 mg daily.  He underwent an EGD 06/08/2022 which was normal.  He presents today for further evaluation regarding left lower abdominal pain which has been mild, no severe LLQ pain and started the first week of December.  He is accompanied by his wife.  He takes Hyoscyamine  as needed for past lower abdominal pain and noted taking it every 4 hours for his LLQ pain over the past few days.  He last took Hyoscyamine  around 8 AM this morning.  He felt a little constipated 2 days ago, skipped one day without passing a BM. He passed a stool the next day. He takes Metamucil daily and stool softener twice daily.  His mody recent colonoscopy was 03/2021 which showed diverticulosis throughout the entire colon, no polyps.  He denies ever being treated with antibiotics for diverticulitis.  He denies having any GERD symptoms on Pantoprazole 40 mg daily.   GI PROCEDURES:   EGD 06/08/2022: - Normal esophagus.  - Normal stomach.  - Normal examined duodenum.  - No specimens collected.  Colonoscopy 04/22/2021: 1. Diverticulosis in the entire examined colon.  2. The examination was otherwise normal on direct and retroflexion views.  3. Hypertrophic anal papilla. Difficulty cleaning post defecation  4. Multicystic lesion of the right kidney on imaging March 2021. (Followed by his PCP) - 10 year recall colonoscopy    Past Medical History:  Diagnosis Date   Allergy    GERD (gastroesophageal reflux disease)     Hyperlipidemia    Hypertension    Past Surgical History:  Procedure Laterality Date   ARTHROSCOPY KNEE W/ DRILLING     lt. knee   COLONOSCOPY  08/18/2010   Dr.[name] -divericulosis   coreneal transplants     both eyes   LIPOMA EXCISION     Medications Ordered Prior to Encounter[1] Allergies[2]  Current Medications, Allergies, Past Medical History, Past Surgical History, Family History and Social History were reviewed in [employer] record.  Review of Systems:   Constitutional: Negative for fever, sweats, chills or weight loss.  Respiratory: Negative for shortness of breath.   Cardiovascular: Negative for chest pain, palpitations and leg swelling.  Gastrointestinal: See HPI.  Musculoskeletal: Negative for back pain or muscle aches.  Neurological: Negative for dizziness, headaches or paresthesias.   Physical Exam: BP (!) 152/80 (BP Location: Left Arm, Patient Position: Sitting)   Pulse (!) 56   Ht 5' 8.75 (1.746 m) Comment: height measured without shoes  Wt 183 lb 2 oz (83.1 kg)   BMI 27.24 kg/m   General: 68 year old male in no acute distress. Head: Normocephalic and atraumatic. Eyes: No scleral icterus. Conjunctiva pink . Ears: Normal auditory acuity. Mouth: Dentition intact. No ulcers or lesions.  Lungs: Clear throughout to auscultation. Heart: Regular rate and rhythm, no murmur. Abdomen: Soft, nontender and nondistended.  Negative shake tenderness.  No masses or hepatomegaly. Normal bowel sounds x 4 quadrants.  Rectal: Deferred. Musculoskeletal: Symmetrical with no gross deformities. Extremities: No edema. Neurological: Alert oriented x 4. No focal deficits.  Psychological: Alert and cooperative. Normal mood and affect  Assessment and Recommendations: 68 year old male with diverticulosis and intermittent LLQ pain has notably increased over the past 3 days, Taking hyoscyamine  every 4 hours.  No current LLQ pain at this time and his abdominal  exam was normal at this time. - CBC, CMP - Hyoscyamine  0.125 mg 1 tab sublingual every 6-8 hours as needed - I discussed scheduling an abdominal/pelvic CT with oral and IV contrast if his LLQ pain recurs - MiraLAX nightly as needed, avoid straining/constipation - Okay to continue Metamucil for now, will hold if LLQ pain worsens - Patient was instructed to go to the emergency room if he develops severe abdominal pain - Patient to contact me in 1 to 2 days with symptom update  GERD, stable - Continue Pantoprazole 40 mg once daily  Colon cancer screening.  Normal colonoscopy 03/2021. - Next screening colonoscopy due 04/2031  Today's encounter was 25 minutes which included precharting, chart/result review, history/exam, face-to-face time used for counseling, formulating a treatment plan with follow-up and documentation.      [1]  Current Outpatient Medications on File Prior to Visit  Medication Sig Dispense Refill   amLODipine (NORVASC) 2.5 MG tablet Take 2.5 mg by mouth daily.     aspirin 81 MG tablet Take 81 mg by mouth daily.       B Complex Vitamins (VITAMIN B COMPLEX PO) Take by mouth.     BYSTOLIC 10 MG tablet Take 10 mg by mouth daily.     Calcium Carbonate-Vitamin D (CALCIUM-VITAMIN D) 500-200 MG-UNIT per tablet Take 1 tablet by mouth 3 (three) times a week.       Cholecalciferol (VITAMIN D-1000 MAX ST) 25 MCG (1000 UT) tablet Take 1,000 Units by mouth daily.     CINNAMON PO Take by mouth.     clobetasol cream (TEMOVATE) 0.05 % Apply 1 Application topically 2 (two) times daily.     cyanocobalamin 100 MCG tablet Take 1 tablet by mouth daily.     diclofenac Sodium (VOLTAREN) 1 % GEL Apply topically.     ezetimibe (ZETIA) 10 MG tablet Take 10 mg by mouth daily.     fish oil-omega-3 fatty acids 1000 MG capsule Take 1 g by mouth 3 (three) times a week.       GARLIC PO Take by mouth.     hydrALAZINE (APRESOLINE) 50 MG tablet Take 75 mg by mouth 2 (two) times daily.     hyoscyamine   (LEVSIN  SL) 0.125 MG SL tablet Place 1 tablet (0.125 mg total) under the tongue every 4 (four) hours as needed. 30 tablet 3   ibuprofen (ADVIL) 800 MG tablet Take 800 mg by mouth every 8 (eight) hours as needed.     ketoconazole (NIZORAL) 2 % shampoo Apply 1 Application topically 2 (two) times a week.     MILK THISTLE PO Take 1 capsule by mouth every other day.     NON FORMULARY CBD oil po     pantoprazole (PROTONIX) 40 MG tablet Take 40 mg by mouth daily.     Saw Palmetto, Serenoa repens, (SAW PALMETTO PO) Take by mouth.     TURMERIC PO Take by mouth.     vitamin C (ASCORBIC ACID) 500 MG tablet Take 500 mg by mouth daily.       No current facility-administered medications on file prior to visit.  [2]  Allergies Allergen Reactions   Amoxicillin     Other Reaction(s): Not available   Diltiazem  Rash and Other (See Comments)

## 2024-02-13 NOTE — Patient Instructions (Addendum)
 Your provider has requested that you go to the basement level for lab work before leaving today. Press B on the elevator. The lab is located at the first door on the left as you exit the elevator.   Please purchase the following medications over the counter and take as directed: Miralax 17g at night as needed  Please call Colleen nurse in 1-2 days  at [phone] with an update on how you are doing.  Please contact us  if your LLQ pain persist   Thank you for trusting me with your gastrointestinal care!   Elida Shawl, CRNP

## 2024-02-14 ENCOUNTER — Ambulatory Visit: Payer: Self-pay | Admitting: Nurse Practitioner

## 2024-02-14 DIAGNOSIS — K5730 Diverticulosis of large intestine without perforation or abscess without bleeding: Secondary | ICD-10-CM

## 2024-02-14 DIAGNOSIS — R1032 Left lower quadrant pain: Secondary | ICD-10-CM

## 2024-02-14 NOTE — Telephone Encounter (Signed)
 Patient is requesting a call back from nurse, he states yesterday when he was in to see Albert Cox she mentioned possibly checking the cysts on his kidneys during his CT and wants to discuss if that it possible. Please advise.

## 2024-02-21 ENCOUNTER — Ambulatory Visit (HOSPITAL_COMMUNITY)
Admission: RE | Admit: 2024-02-21 | Discharge: 2024-02-21 | Disposition: A | Discharge status: Home / Self Care | Source: Ambulatory Visit | Attending: Nurse Practitioner | Admitting: Nurse Practitioner

## 2024-02-21 DIAGNOSIS — R1032 Left lower quadrant pain: Secondary | ICD-10-CM | POA: Diagnosis present

## 2024-02-21 DIAGNOSIS — K5730 Diverticulosis of large intestine without perforation or abscess without bleeding: Secondary | ICD-10-CM | POA: Insufficient documentation

## 2024-02-21 MED ORDER — IOHEXOL 300 MG/ML  SOLN
100.0000 mL | Freq: Once | INTRAMUSCULAR | Status: AC | PRN
  Administered 2024-02-21: 100 mL via INTRAVENOUS

## 2024-02-21 MED ORDER — IOHEXOL 9 MG/ML PO SOLN
500.0000 mL | ORAL | Status: AC
  Administered 2024-02-21: 500 mL via ORAL

## 2024-02-26 ENCOUNTER — Telehealth: Payer: Self-pay | Admitting: Nurse Practitioner

## 2024-02-26 NOTE — Telephone Encounter (Signed)
 Inbound call from patient stating that he would like to know when his CT results would be back due to him leaving to [place]  on the [place] [address]. Patient is requesting a call back. Please advise.

## 2024-02-26 NOTE — Telephone Encounter (Signed)
 Called radiology reading room to escalate the reading.

## 2024-02-29 ENCOUNTER — Ambulatory Visit: Payer: Self-pay | Admitting: Nurse Practitioner

## 2024-03-04 NOTE — Telephone Encounter (Signed)
CT report faxed as requested

## 2024-04-02 ENCOUNTER — Encounter: Payer: Self-pay | Admitting: Gastroenterology

## 2024-04-02 ENCOUNTER — Telehealth: Payer: Self-pay | Admitting: Nurse Practitioner

## 2024-04-02 ENCOUNTER — Other Ambulatory Visit: Payer: Self-pay

## 2024-04-02 DIAGNOSIS — R911 Solitary pulmonary nodule: Secondary | ICD-10-CM

## 2024-04-02 NOTE — Telephone Encounter (Signed)
 Rock, refer to CTAP 02/20/2024 results and notes in sidebar. CTAP showed  a mixed solid/ground-glass noncalcified nodule in the left lung lower lobe, inferomedially measuring up to 1.1 x 1.1 cm, which has increased in size since the prior study from 05/17/2019 when it measured up to 6 x 6 mm. 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 Pulmonary consultation and additional imaging with PET-CT scan recommended.  Since the pulmonary nodule is not a GI issue, it was appropriate for the patient's PCP to manage this work up. Since the PCP did not provide pulmonary referral, please enter a pulmonary referral to evaluate a 1.1 x 1.1 left lung lesion which has grown since prior imaging in 2021.   Pls fax a copy of the CTAP 02/20/2024 results to patient's PCP Dr. Oneil Robins   Please have patient contact me if he has not received a call from pulmonary within the next 1 to 2 weeks.  Thank you

## 2024-04-02 NOTE — Telephone Encounter (Signed)
 Patient called requesting a pulmonary referral. Advised patient based on previous notes, Colleen advised to follow with PCP for referral. Patient states he spoke with his PCP but would like Colleen to refer him instead. Patient is requesting a call back. Please advise, thank you

## 2024-04-02 NOTE — Telephone Encounter (Signed)
 Colleen please see note below regarding pulmonary referral. Are you willing to refer pt to pulmonary?
